# Patient Record
Sex: Female | Born: 1975 | Race: White | Hispanic: No | Marital: Married | Smoking: Current every day smoker
Health system: Southern US, Community
[De-identification: ages and names within clinical notes are randomized; demographics above are authoritative.]

## PROBLEM LIST (undated history)

## (undated) DIAGNOSIS — F32A Depression, unspecified: Secondary | ICD-10-CM

## (undated) DIAGNOSIS — R569 Unspecified convulsions: Secondary | ICD-10-CM

## (undated) DIAGNOSIS — R51 Headache: Secondary | ICD-10-CM

## (undated) DIAGNOSIS — F329 Major depressive disorder, single episode, unspecified: Secondary | ICD-10-CM

## (undated) DIAGNOSIS — R519 Headache, unspecified: Secondary | ICD-10-CM

## (undated) DIAGNOSIS — F419 Anxiety disorder, unspecified: Secondary | ICD-10-CM

## (undated) DIAGNOSIS — K219 Gastro-esophageal reflux disease without esophagitis: Secondary | ICD-10-CM

## (undated) HISTORY — PX: CARPAL TUNNEL RELEASE: SHX101

## (undated) HISTORY — PX: SHOULDER ARTHROSCOPY: SHX128

## (undated) HISTORY — PX: ABDOMINAL HYSTERECTOMY: SHX81

## (undated) HISTORY — PX: OTHER SURGICAL HISTORY: SHX169

---

## 2003-04-18 ENCOUNTER — Emergency Department (HOSPITAL_COMMUNITY): Admission: EM | Admit: 2003-04-18 | Discharge: 2003-04-18 | Payer: Self-pay | Admitting: Emergency Medicine

## 2004-09-05 ENCOUNTER — Emergency Department (HOSPITAL_COMMUNITY): Admission: EM | Admit: 2004-09-05 | Discharge: 2004-09-05 | Payer: Self-pay | Admitting: *Deleted

## 2005-01-02 ENCOUNTER — Inpatient Hospital Stay (HOSPITAL_COMMUNITY): Admission: AD | Admit: 2005-01-02 | Discharge: 2005-01-02 | Payer: Self-pay | Admitting: *Deleted

## 2005-01-02 ENCOUNTER — Ambulatory Visit: Payer: Self-pay | Admitting: Obstetrics and Gynecology

## 2005-01-21 ENCOUNTER — Encounter (INDEPENDENT_AMBULATORY_CARE_PROVIDER_SITE_OTHER): Payer: Self-pay | Admitting: *Deleted

## 2005-01-21 ENCOUNTER — Ambulatory Visit: Payer: Self-pay | Admitting: *Deleted

## 2005-02-06 ENCOUNTER — Ambulatory Visit: Payer: Self-pay | Admitting: Family Medicine

## 2005-02-10 ENCOUNTER — Ambulatory Visit (HOSPITAL_COMMUNITY): Admission: RE | Admit: 2005-02-10 | Discharge: 2005-02-10 | Payer: Self-pay | Admitting: *Deleted

## 2005-02-20 ENCOUNTER — Ambulatory Visit: Payer: Self-pay | Admitting: *Deleted

## 2005-02-20 ENCOUNTER — Inpatient Hospital Stay (HOSPITAL_COMMUNITY): Admission: AD | Admit: 2005-02-20 | Discharge: 2005-02-23 | Payer: Self-pay | Admitting: *Deleted

## 2005-02-22 ENCOUNTER — Ambulatory Visit: Payer: Self-pay | Admitting: *Deleted

## 2005-05-06 ENCOUNTER — Encounter (INDEPENDENT_AMBULATORY_CARE_PROVIDER_SITE_OTHER): Payer: Self-pay | Admitting: Specialist

## 2005-05-06 ENCOUNTER — Ambulatory Visit: Payer: Self-pay | Admitting: *Deleted

## 2005-06-16 ENCOUNTER — Ambulatory Visit (HOSPITAL_COMMUNITY): Admission: RE | Admit: 2005-06-16 | Discharge: 2005-06-16 | Payer: Self-pay | Admitting: Obstetrics and Gynecology

## 2005-06-16 ENCOUNTER — Ambulatory Visit: Payer: Self-pay | Admitting: Obstetrics and Gynecology

## 2005-06-30 ENCOUNTER — Inpatient Hospital Stay (HOSPITAL_COMMUNITY): Admission: AD | Admit: 2005-06-30 | Discharge: 2005-06-30 | Payer: Self-pay | Admitting: Obstetrics & Gynecology

## 2005-12-17 ENCOUNTER — Ambulatory Visit: Payer: Self-pay | Admitting: Obstetrics and Gynecology

## 2005-12-24 ENCOUNTER — Ambulatory Visit (HOSPITAL_COMMUNITY): Admission: RE | Admit: 2005-12-24 | Discharge: 2005-12-24 | Payer: Self-pay | Admitting: Obstetrics & Gynecology

## 2005-12-29 ENCOUNTER — Encounter (INDEPENDENT_AMBULATORY_CARE_PROVIDER_SITE_OTHER): Payer: Self-pay | Admitting: Specialist

## 2005-12-29 ENCOUNTER — Ambulatory Visit (HOSPITAL_COMMUNITY): Admission: RE | Admit: 2005-12-29 | Discharge: 2005-12-29 | Payer: Self-pay | Admitting: Obstetrics and Gynecology

## 2005-12-29 ENCOUNTER — Ambulatory Visit: Payer: Self-pay | Admitting: Obstetrics and Gynecology

## 2006-01-22 ENCOUNTER — Ambulatory Visit: Payer: Self-pay | Admitting: Obstetrics and Gynecology

## 2006-04-22 ENCOUNTER — Ambulatory Visit: Payer: Self-pay | Admitting: Obstetrics & Gynecology

## 2006-04-22 ENCOUNTER — Inpatient Hospital Stay (HOSPITAL_COMMUNITY): Admission: AD | Admit: 2006-04-22 | Discharge: 2006-04-22 | Payer: Self-pay | Admitting: Obstetrics & Gynecology

## 2006-05-26 ENCOUNTER — Ambulatory Visit: Payer: Self-pay | Admitting: Obstetrics and Gynecology

## 2006-05-26 ENCOUNTER — Inpatient Hospital Stay (HOSPITAL_COMMUNITY): Admission: RE | Admit: 2006-05-26 | Discharge: 2006-05-29 | Payer: Self-pay | Admitting: Obstetrics and Gynecology

## 2006-05-26 ENCOUNTER — Encounter (INDEPENDENT_AMBULATORY_CARE_PROVIDER_SITE_OTHER): Payer: Self-pay | Admitting: Specialist

## 2006-05-30 ENCOUNTER — Inpatient Hospital Stay (HOSPITAL_COMMUNITY): Admission: AD | Admit: 2006-05-30 | Discharge: 2006-05-30 | Payer: Self-pay | Admitting: Gynecology

## 2006-06-12 ENCOUNTER — Ambulatory Visit: Payer: Self-pay | Admitting: Gynecology

## 2008-03-30 ENCOUNTER — Ambulatory Visit: Payer: Self-pay | Admitting: Obstetrics and Gynecology

## 2008-05-31 ENCOUNTER — Observation Stay (HOSPITAL_COMMUNITY): Admission: RE | Admit: 2008-05-31 | Discharge: 2008-06-01 | Payer: Self-pay | Admitting: General Surgery

## 2008-05-31 ENCOUNTER — Encounter (INDEPENDENT_AMBULATORY_CARE_PROVIDER_SITE_OTHER): Payer: Self-pay | Admitting: General Surgery

## 2009-08-03 ENCOUNTER — Ambulatory Visit (HOSPITAL_COMMUNITY): Admission: RE | Admit: 2009-08-03 | Discharge: 2009-08-03 | Payer: Self-pay | Admitting: General Surgery

## 2009-11-13 ENCOUNTER — Emergency Department: Payer: Self-pay | Admitting: Emergency Medicine

## 2010-08-04 ENCOUNTER — Encounter: Payer: Self-pay | Admitting: *Deleted

## 2010-09-29 LAB — CBC
HCT: 41 % (ref 36.0–46.0)
WBC: 8.3 10*3/uL (ref 4.0–10.5)

## 2010-09-29 LAB — DIFFERENTIAL
Basophils Absolute: 0 10*3/uL (ref 0.0–0.1)
Basophils Relative: 0 % (ref 0–1)
Eosinophils Absolute: 0.1 10*3/uL (ref 0.0–0.7)
Eosinophils Relative: 1 % (ref 0–5)
Lymphocytes Relative: 40 % (ref 12–46)
Monocytes Relative: 8 % (ref 3–12)

## 2010-09-29 LAB — PROTIME-INR: INR: 1.1 (ref 0.00–1.49)

## 2010-09-29 LAB — APTT: aPTT: 28 seconds (ref 24–37)

## 2010-11-26 NOTE — Group Therapy Note (Signed)
NAME:  Krystal [name], MOMON NO.:  [id number]   MEDICAL RECORD NO.:  [id number]          PATIENT TYPE:  WOC   LOCATION:  [facility]                   FACILITY:  [facility]   PHYSICIAN:  Argentina Donovan, MD        DATE OF BIRTH:  February 26, 1976   DATE OF SERVICE:  03/30/2008                                  CLINIC NOTE   The patient is a 35 year old Caucasian female gravida 4, para 3-0-2-4,  who underwent total abdominal hysterectomy and revision of scar several  years ago.  She comes in today for annual exam with chief complaint of  swelling, bleeding, hemorrhoids with skin that breaks open, does not  heal, and is sore all the time.  She also is complaining of pain around  the urethra, that showers or urinating bother her.   PHYSICAL EXAMINATION:  BREASTS:  Her breasts are symmetrical with  implants.  No dominant masses.  No axillary nodes noted.  No nipple  discharge.  ABDOMEN:  Soft, flat, and nontender.  No masses or organomegaly with a  well-healed suprapubic transverse scar.  GENITALIA:  The external genitalia is significant for a small lesion on  the left labia up near the urethra, which was cultured for virus.  Also,  wet prep was taken.  The vagina, however, is clean with no significant  discharge noted.  Status hysterectomy, well rugated, and introitus is  marital.  BUS appeared normal.  The perineum between the portion of the  vagina and the anal area seems thin with small fissures.  She has  significant external hemorrhoids, but was very resistant to rectal  examination internally.  On bimanual pelvic examination, the ovary  seemed normal.  No pelvic masses were noted.   IMPRESSION:  Symptomatic hemorrhoids with anal fissures, urethritis.   Plan is to refer the patient to a general surgeon for hemorrhoid removal  and give the patient a trial for the urethritis of doxycycline 100 mg a  day for 10 days and application of Mycolog II frequently for at least 1  week on the  perineal area.            ______________________________  Argentina Donovan, MD     PR/MEDQ  D:  03/30/2008  T:  03/31/2008  Job:  [id number]

## 2010-11-26 NOTE — Op Note (Signed)
NAMESHEIKA, COUTTS            ACCOUNT NO.:  [id number]   MEDICAL RECORD NO.:  [id number]          PATIENT TYPE:  OBV   LOCATION:  0002                         FACILITY:  [facility]   PHYSICIAN:  Lennie Muckle, MD      DATE OF BIRTH:  Aug 26, 1975   DATE OF PROCEDURE:  05/31/2008  DATE OF DISCHARGE:                               OPERATIVE REPORT   PREOPERATIVE DIAGNOSIS:  Hemorrhoids.   POSTOPERATIVE DIAGNOSIS:  Internal and external hemorrhoids.   PROCEDURE:  Excision of hemorrhoids x2.   SURGEON:  Lennie Muckle, MD.   ASSISTANT:  Leonie Man, MD   ANESTHESIA:  General endotracheal anesthesia.   FINDINGS:  Large internal and external hemorrhoid noted at 2 o'clock and  7 to 8 o'clock.  No evidence of fissure.  No evidence of a pile.   ESTIMATED BLOOD LOSS:  15 mL.   DRAINS:  No drains were placed.   COMPLICATIONS:  No immediate complications.   INDICATIONS FOR PROCEDURE:  Ms. Laural Benes is a 35 year old female who had  had problems with hemorrhoids since pregnancy 13 years ago.  She has had  several episodes of acute pain and swelling.  She has had multiple  trials of medications.  She attempted exam in the office but she has a  significant amount of pain therefore I could not get a full exam to  evaluate for either anal fissure or internal component.  Informed  consent was obtained for a rectal exam under anesthesia,  hemorrhoidectomy with a possible sphincterotomy.   DESCRIPTION OF PROCEDURE:  Ms. Laural Benes was identified in the  preoperative holding suite room.  She did receive IV antibiotics and was  taken to the operating room.  Once in the operating room she was  intubated.  She was then turned in a prone jackknife position.  Her  perineum was prepped and draped in the usual sterile fashion.  External  hemorrhoids were seen on examination.  There was also an internal  component as well.  Digital examination revealed good laxity with  ability to dilate with 4 fingers.  No evidence of a stricture or a pile.  The two largest hemorroids were noted at around 7 to 8 o'clock as well  as 2 o'clock. Using anoscopy and an Allis clamp we elected to excise the  largest hemorrhoid around 7 to 8 o'clock.  Using electrocautery got to  the base of the hemorrhoid and fully excised the hemorrhoid tissue with  care noted to avoid internal and external sphincter muscle.  Using a  chromic suture was used to close the skin.  Then further exam revealed  the second largest hemorrhoid tissue noted at 2 o'clock.  Again this was  incised at the base with internal component carefully avoiding the  internal and external sphincters.  This was also closed with a 3-0  chromic suture.  After this the patient had significant improvement in  the exam of the tissue.  There was a small hemorrhoid noted around 12  o'clock.  Due to the concern for having stricturing I elected not to  perform any further excision of  tissue.  Removing the tape of the  buttocks there appeared to be no extrusion of mucosa or further evidence  of prolapsing hemorrhoids.  A piece of Gelfoam was placed in the anus.  She was anesthetized with approximately 40 mL of Marcaine with  epinephrine at 12 o'clock, 3 o'clock, 6 o'clock as well as 9 o'clock.  Each hemorrhoid cushion was also injected with approximately 5 mL of the  anesthetic.  On final inspection there was no bleeding.  The Gelfoam was  applied to the anus.  The patient had dry gauze then applied.  She was  then turned to the supine position, extubated and transported to the  post anesthesia care unit in stable condition.  She will be given  Vicodin 7.5/200 one to two every 4 hours for pain #45 was dispensed.  She was also instructed to take Tylenol over-the-counter and began two  stool softeners beginning today.  She will be told to perform sitz baths  three times a day and will follow up in approximately 2-3 weeks.      Lennie Muckle, MD   Electronically Signed     ALA/MEDQ  D:  05/31/2008  T:  05/31/2008  Job:  [id number]   cc:   Lorenso Quarry  Fax: [phone]

## 2010-11-29 NOTE — Op Note (Signed)
Krystal [name], [name]            ACCOUNT NO.:  [id number]   MEDICAL RECORD NO.:  [id number]          PATIENT TYPE:  INP   LOCATION:  9310                          [facility]ILITY[facility]   PHYSICIAN:  Phil D. Okey Dupre, M.D.     DATE OF BIRTH:  [birth year]   DATE OF PROCEDURE:  05/26/2006  DATE OF DISCHARGE:                                 OPERATIVE REPORT   PREOPERATIVE DIAGNOSIS:  A 35 year old female with pelvic pain, dysmenorrhea  and atrophic scar.   POSTOPERATIVE DIAGNOSES:  87. A 35 year old female with pelvic pain, dysmenorrhea and atrophic scar.  2. Pelvic adhesions.   PROCEDURES:  1. Total abdominal hysterectomy.  2. Scar revision.   SURGEON:  Dr. Argentina Donovan.   ASSISTANT:  Dr. Elsie Lincoln.   ANESTHESIA:  General.   SPECIMEN:  Uterus and cervix to pathology.   EBL:  150.   COMPLICATIONS:  None.   FINDINGS:  1. A slightly enlarged uterus.  2. Grossly normal ovaries,.  3. Surgically ligated fallopian tubes.  4. Pelvic adhesions.  5. A simple cyst on the right ovary, approximately 1 x 1 cm.  6. Extremely atrophic scar that was sunken in and very bothersome to the      patient.   PROCEDURE:  After informed consent was obtained, the patient was taken to  the operating room where general anesthesia was induced.  The patient was  placed in the dorsal supine position, the __________ draped in normal  sterile fashion.  A Foley was placed in the bladder.  The skin was incised  with a scalpel and the scar was removed down to the fascia.  The fascia was  incised in the midline and the incision was extended bilaterally.  The  superior and inferior aspects of the fascial incision were grasped with  Kocher clamps, tented up, and dissected off sharply and bluntly from  underlying layers of rectus muscles.  The rectus muscle was separated in the  midline, the peritoneum was entered bluntly and this incision was extended  both superior and inferiorly with good visualization of the  bladder.  The  Balfour retractor was placed into the abdomen and the bowel was packed away.  The uterus was brought into the surgical field and some adhesions were taken  down.  The round ligaments were identified, clamped, transected, and suture  ligated with chromic.  The bladder flap was then created.  The uterine  ovarian pedicles were then clamped, transected and suture ligated with 0  chromic x2 and good hemostasis was noted.  The uterine arteries were  skeletonized, clamped, transected and suture ligated with 0 Vicryl.  The  cardinal ligaments and uterosacral ligaments were then clamped, transected  and suture ligated with chromic in a stepwise fashion.  Good hemostasis was  noted once at the bottom of the cervix.  A scalpel was used to enter the  vagina and the Jorgenson scissors were used to cut at the top of the vagina  and remove the cervix from the vagina.  A specimen was handed off to the  surgical technician.  The vaginal angles were closed  with 0 Vicryl and good  hemostasis was noted.  The vaginal cuff was closed with 0 Vicryl in a  running locked fashion and good hemostasis was noted.  The peritoneal cavity  was copiously irrigated with warm saline and good hemostasis was noted.  All  instruments and packs were removed from the abdomen and hemostasis was  assured one last time.  The peritoneum was hemostatic.  The rectus muscles  were reapproximated loosely in the middle with chromic. The fascia was  closed with #0 Vicryl in a running fashion and good hemostasis was noted.  The subcutaneous tissue was copiously irrigated and subcutaneous tissue was  brought together with #2-0 Vicryl mattress sutures and the skin was brought  together with #4-0 Vicryl mattress sutures on a Keith needle in an  interrupted fashion.  The patient tolerated the procedure well.  Sponge,  lap, instrument and needle count were correct x2.  The patient went to the  recovery room in stable  condition.     ______________________________  Lesly Dukes, M.D.    ______________________________  Javier Glazier. Okey Dupre, M.D.    Lora Paula  D:  05/26/2006  T:  05/26/2006  Job:  [id number]

## 2010-11-29 NOTE — Group Therapy Note (Signed)
NAME:  Krystal [name], Krystal [name] NO.:  [id number]   MEDICAL RECORD NO.:  [id number]          PATIENT TYPE:  WOC   LOCATION:  [facility]                   FACILITY:  [facility]   PHYSICIAN:  Argentina Donovan, MD        DATE OF BIRTH:  05-Jun-1976   DATE OF SERVICE:  12/17/2005                                    CLINIC NOTE   HISTORY:  The patient is a 35 year old, gravida 3, para 3-0-0-4 who  delivered twins by cesarean section 9 months ago and then in December of  last year underwent a laparoscopic tubal ligation at which time left adnexal  adhesions where the tube was adhesed to the anterior abdominal wall were  dissected. Since that time, the patient has had lower abdominal pain at the  incision site especially on the left side. She has also complained of  continual intramenstrual spotting, menstrual distress and premenstrual  symptoms since the birth of her baby and have gotten worse since her tubal  ligation. She was placed one month ago on oral contraceptives to try and  control the bleeding by some other doctor but she spotted in spite of that  and in the last day or so she developed signs of urinary tract infection  i.e. frequency, dysuria, and false urge. She also complains of a low back  pain on the right feeling like a hot coal but she has had problem with lower  chronic back pain probably due to a lumbar disk. Recently her grandmother  died of ovarian cancer and she thinks she has all those symptoms.   EXAMINATION:  On examination, her abdomen is soft, flat but very gassy,  nontender to deep palpation without rebound or guarding. The incision site  of the C section that was done in [facility] and the laparoscopy site  seemed normal. External genitalia is normal, BUS within normal limits. The  vagina is pink and well-rugated with a pinkish creamy discharge that has a  foul odor. The uterus is anterior of normal size, shape, consistency and the  adnexa appears normal.   PLAN:  Get an ultrasound, urinalysis, schedule the patient for a  hysteroscopy D&C and at the same time while she is in the operating room to  inject Kenalog-10 into the surgical sites of the cesarean section and  laparoscopy to help control the pain if it is localized and the itching. I  am going to give the patient a prescription for Pyridium, Flagyl and  Macrobid.   IMPRESSION:  Probably urinary tract infection, intractable abnormal  intramenstrual bleeding, dysmenorrhea, possible premenstrual dysphoric  disorder.           ______________________________  Argentina Donovan, MD     PR/MEDQ  D:  12/17/2005  T:  12/18/2005  Job:  [id number]

## 2010-11-29 NOTE — Discharge Summary (Signed)
Krystal [name], Krystal [name]            ACCOUNT NO.:  [id number]   MEDICAL RECORD NO.:  [id number]          PATIENT TYPE:  INP   LOCATION:  9310                          [facility]   PHYSICIAN:  Phil D. Okey Dupre, M.D.     DATE OF BIRTH:  03-16-1976   DATE OF ADMISSION:  05/26/2006  DATE OF DISCHARGE:  05/29/2006                                 DISCHARGE SUMMARY   The patient is a 35 year old, multiparous, white female who underwent total  abdominal hysterectomy and revision of abdominal wound on the day of  admission.  She has done very well postoperatively with the exception of  some problem with pain control which was anticipated with her pain  toleration normal prior to admission.  She had been afebrile her entire  course.  She has a discharge hemoglobin and 10.2 with hematocrit 29.6.  White count has been normal throughout her stay.  The wound has been healing  well.  The mattress sutures were removed on the day of discharge.  On the  far right angle, there was a small seroma which drained and was cleaned with  peroxide and then reclosed with a Steri-Strip.   DISCHARGE MEDICATIONS:  1. Percocet for pain.  2. Motrin for pain.  3. Slow FE for iron replacement.  4. Nicotine patch which she is going to continue at home which has      successfully been used here.,  5. Terazol for some vulvar itching probably secondary to the antibiotic      she was on.   DISCHARGE INSTRUCTIONS:  The patient has been given detailed instructions as  to activity, followup and diet especially related to heavy lifting and  driving.  She will be seen in the office in two weeks.  She has also been  given instructions with regard to if the wound opens or if she has any high  fever to call earlier or come in sooner.   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           ______________________________  Javier Glazier. Okey Dupre, M.D.     PDR/MEDQ  D:  05/29/2006  T:   05/29/2006  Job:  [id number]

## 2010-11-29 NOTE — Group Therapy Note (Signed)
NAMEMarland Kitchen  JAY, KEMPE NO.:  [id number]   MEDICAL RECORD NO.:  [id number]          PATIENT TYPE:  WOC   LOCATION:  [facility]                   [facility]ACILITY:  [facility]   PHYSICIAN:  Ginger Carne, MD DATE OF BIRTH:  07/02/1976   DATE OF SERVICE:  06/12/2006                                  CLINIC NOTE   Mrs. Krystal [name] returns today following two weeks after having had a  hysterectomy. The patient had undergone a total abdominal hysterectomy  with revision of an abdominal wound on the 13th of November 2007. Her  incision looks clean, dry, with a normal healing ridge and slight upper  incisional swelling. I reassured the patient of same. I think she is  overly concerned about the swelling around the incision. I reassured her  that this is normal and that it would take several months to see  complete resolution, and evenness of the incision. She has slight scant  fascial flowage, which is also normal. It is yellow discharge, but not  symptomatic. She will return in four weeks for her routine postoperative  care. In addition, the patient was reassured about an ultrasound  obtained in [facility], which demonstrated a small physiological ovarian cyst  on the right ovary.           ______________________________  Ginger Carne, MD     SHB/MEDQ  D:  06/12/2006  T:  06/12/2006  Job:  [id number]

## 2010-11-29 NOTE — Discharge Summary (Signed)
NAMEDANESE, DORSAINVIL            ACCOUNT NO.:  [id number]   MEDICAL RECORD NO.:  [id number]          PATIENT TYPE:  INP   LOCATION:  9157                          [facility]   PHYSICIAN:  Phil D. Okey Dupre, M.D.     DATE OF BIRTH:  August 01, 1975   DATE OF ADMISSION:  02/20/2005  DATE OF DISCHARGE:  02/23/2005                                 DISCHARGE SUMMARY   HOSPITAL COURSE:  The patient is a 35 year old gravida 5 para 2-0-2-2 who  presented at 32 weeks in preterm labor with complaint of headache, fatigue,  and left leg swelling more than the right. She was being followed by the  high-risk clinic and a twin gestation. On admission, she had a cervical os  effacement of 80% with a dilatation of 2.5 cm. PIH labs were normal and she  was GBS positive. She was admitted and given betamethasone, and tocolytics  to try and control her labor. On the February 21, 2005, because of the lact of  ability to stop labor, she was transferred from antepartum to labor and  delivery for magnesium sulfate, which was started on the August 11. On  August 12, we were very certain that she was going to deliver and we started  prophylaxis with penicillin for GBS, and labor became impossible to stop. We  realized at this time the patient was in active labor. The NICU at this time  had no available beds so we made arrangements for a transfer to [facility], which was carried out on the day of discharge, August 12.   DISCHARGE DIAGNOSIS:  Thirty-two-week gestation, preterm labor, probable  inevitable delivery.   PLAN:  Transfer to [facility] because of NICU lack of beds.           ______________________________  Javier Glazier Okey Dupre, M.D.     PDR/MEDQ  D:  04/09/2005  T:  04/09/2005  Job:  [id number]

## 2010-11-29 NOTE — H&P (Signed)
NAME:  Krystal [name], Krystal [name]                ACCOUNT NO.:  0   MEDICAL RECORD NO.:  [id number]           PATIENT TYPE:   LOCATION:                                 FACILITY:   PHYSICIAN:  Phil D. Okey Dupre, M.D.     DATE OF BIRTH:  1976-05-03   DATE OF ADMISSION:  05/26/2006  DATE OF DISCHARGE:                                HISTORY & PHYSICAL   CHIEF COMPLAINT:  Severe lower abdominal pain and pain in incision with  extremely heavy periods and weakness.   HISTORY OF PRESENT ILLNESS:  The patient is a 35 year old, white female, G4,  P3-0-2-4 whose first baby was cesarean section for breech, the second one a  vaginal delivery and the last in August, 2 years ago, of twins by cesarean  section.  Following that, she underwent laparoscopic sterilization and had a  moderate amount of pelvic adhesions especially in the area of the left tube  and the anterior wall.  These were taken down during the procedure.  She had  complaint after that of itching in the laparoscopic and surgical scar from  the C-section which was treated with Kenalog 10.  She had a small  endometrial polyp and pathology was normal.  The patient has continued to  have increasingly severe dysmenorrhea that makes it difficult for her to  take care of her kids with chronic pelvic pain, spotting and bleeding almost  every day.  She does have chronic back pain with several hot spots that  radiate around the lower abdomen and seems to be worse when she is having a  period.  We have discussed options and because of the aesthetically  unpleasant and painful thinned out suprapubic scar, we have opted to do a  hysterectomy abdominally.  We will revise her incision and do a total  abdominal hysterectomy.  We discussed in detail the procedure with possible  complications, especially those related to anesthesia, injury to bowel,  urinary tract, hemorrhage and infection.  The patient is scheduled for a  total abdominal hysterectomy.   PAST  MEDICAL HISTORY:  Pregnancy-induced hypertension.   PAST SURGICAL HISTORY:  1. Bilateral tubal ligation.  2. D&C.  3. Two cesarean sections.   SOCIAL HISTORY:  She smokes a half pack a day.  She does not drink or use  illicit drugs.   FAMILY HISTORY:  Noncontributory.   REVIEW OF SYSTEMS:  Negative with the exception of in the history of present  illness.   PHYSICAL EXAMINATION:  VITAL SIGNS:  Blood pressure 109/71, respirations 18  per minute, pulse 76, temperature 97.7.  GENERAL:  Well-developed, slender, white female in no acute distress.  HEENT:  Within normal limits.  PERRLA.  NECK:  Supple with no masses.  Thyroid symmetrical with no dominant masses.  LUNGS:  Clear to auscultation and percussion.  HEART:  No murmur, normal sinus rhythm.  No rubs or gallops.  PMI fifth IS  and MCL.  BREASTS:  Symmetrical without dominant masses.  No nipple discharge.  BACK:  Erect and nontender.  ABDOMEN:  Soft, flat.  Area of  the lower surgical scar markedly atrophic,  but no guarding or rebound.  GENITALIA:  External is normal.  Cervix is clean and parous.  Adnexa normal.  Vagina is clean and well-rugated.  Uterus is normal size, shape and  consistency.  RECTAL:  No masses.  SKIN:  Normal turgor and pallor.   IMPRESSION:  Chronic pelvic pain with severe disabling dysmenorrhea and  menorrhagia and painful surgical scar.   PLAN:  Total abdominal hysterectomy and revision of lower abdominal scar.           ______________________________  Javier Glazier. Okey Dupre, M.D.     PDR/MEDQ  D:  05/19/2006  T:  05/19/2006  Job:  [id number]

## 2010-11-29 NOTE — Group Therapy Note (Signed)
NAME:  Krystal [name], Krystal [name] NO.:  [id number]   MEDICAL RECORD NO.:  [id number]          PATIENT TYPE:  WOC   LOCATION:  [facility]                   FACILITY:  [facility]   PHYSICIAN:  Carolanne Grumbling, M.D.   DATE OF BIRTH:  11-12-1975   DATE OF SERVICE:  05/06/2005                                    CLINIC NOTE   Patient is a 35 year old G5, P2-2-4 here for her six-week postpartum  examination.  She had a repeat cesarean section on February 24, 2005 at  [place].  She had to be transferred there to deliver her twins because  there were no NICU beds available here.  She went to preterm labor and also  had preeclampsia.  Since the delivery patient has done relatively well.  She  has had spotting daily.  She had Depo on August 16 and reports the last time  she had Depo she also bled every day.  She has had intercourse.  Has noticed  some foul smelling vaginal discharge.   PHYSICAL EXAMINATION:  VITAL SIGNS:  Per chart.  GENERAL:  Well-developed, well-nourished female in no apparent distress.  CARDIOVASCULAR:  Regular rate and rhythm.  LUNGS:  Clear to auscultation bilaterally.  ABDOMEN:  Soft, nontender, nondistended.  She does have an area around her  incision that feels numb.  Incision clean, dry, intact.  GENITOURINARY:  No external genitalia.  Cervix is parous and she does have a  Nabothian cyst at 6 o'clock.  Pap smear was done.  Gonorrhea and Chlamydia  and wet prep also done.   IMPRESSION:  1.  Six-week postpartum.  2.  Desire for bilateral tubal ligation.   PLAN:  Will notify patient results of her laboratories.  Counseled on the  risks and benefits of tubal and she wants to proceed.  Surgery will be  scheduled for her.           ______________________________  Carolanne Grumbling, M.D.     TW/MEDQ  D:  05/06/2005  T:  05/07/2005  Job:  [id number]

## 2010-11-29 NOTE — Op Note (Signed)
Krystal [name], Krystal [name]            ACCOUNT NO.:  [id number]   MEDICAL RECORD NO.:  [id number]          PATIENT TYPE:  AMB   LOCATION:  [facility]                           [facility]:  [facility]   PHYSICIAN:  Phil D. Okey Dupre, M.D.     DATE OF BIRTH:  May 05, 1976   DATE OF PROCEDURE:  12/29/2005  DATE OF DISCHARGE:                                 OPERATIVE REPORT   PROCEDURE:  Hysteroscopic examination, dilatation and curettage and  ____________ abdominal scars.   PREOPERATIVE DIAGNOSIS:  Menometrorrhagia, dysmenorrhea and chronic pruritus  of abdominal cicatrices.   DESCRIPTION:  Procedure went as follows:  Under satisfactory general  anesthesia with the patient in dorsal lithotomy position, the perineum and  vagina were prepped and draped in the usual sterile manner as were the  surgical scars from a previous C-section and periumbilical from a  laparoscopy.  A total of 10 mL of Kenalog 10 was injected subcutaneously  under each of those scars to try to control the patient's severe itching.  At that time, the perineum was draped in the usual sterile manner.  Bimanual  pelvic examination revealed a small anterior uterus, freely movable with a  normal cul-de-sac.  The adnexa was normal.  Weighted speculum was placed in  the posterior fourchette of the vagina through a marital introitus. BUS  within normal limits.  The vagina was clean and well rugated.  The anterior  lip of a clean parous cervix was grasped with a single-tooth tenaculum.  Uterine cavity sounded to 7 cm.  Cervical os dilated to #7 Hagar dilator.  A  12-degrees hysteroscope was used, inserted to the fundus, and using normal  saline as a dilating medium, the uterine cavity was explored and found to be  completely within normal limits with the exception of one tiny little polyp  just near the introitus, which then the uterus was curetted, the cavity with  a very small sharp curette, and the tissue sent for pathological diagnosis.  The  uterine cavity was then re-explored with a hysteroscope and found that  the polyp had been removed.  Tenaculum and speculum removed from vagina.  The patient transferred to the recovery in satisfactory condition, having  tolerated the procedure well.           ______________________________  Javier Glazier. Okey Dupre, M.D.     PDR/MEDQ  D:  12/29/2005  T:  12/30/2005  Job:  [id number]

## 2010-11-29 NOTE — Group Therapy Note (Signed)
NAMEMarland Kitchen  [name], Krystal NO.:  [id number]   MEDICAL RECORD NO.:  [id number]          PATIENT TYPE:  WOC   LOCATION:  [facility]                   [facility]AC[facility]   PHYSICIAN:  Argentina Donovan, MD        DATE OF BIRTH:  1975/11/21   DATE OF SERVICE:  04/28/2006                                    CLINIC NOTE   The patient is a 35 year old white female, gravida 4, para 2-0-2-4, whose  first baby was cesarean section for breech. The second baby was a vaginal  delivery, and in August of last year had twins by cesarean section.  Following that, she underwent laparoscopic sterilization and had a moderate  amount of pelvic adhesions, especially in the area of the left tube to the  anterior wall which were taken down during that procedure. She had a  complaint after that of severe teaching in the area of her laparoscopic scar  and her previous surgical scar for her C section which was done in [facility]. We injected those was Kenalog 10, and she underwent D&C for  metromenorrhagia and dysmenorrhea.  She had a small endometrial polyp, but  the pathology was normal. The patient has continued having severe  dysmenorrhea with chronic pelvic pain and spotting or bleeding almost all  the time. She does have chronic back pain with several hot spots that  radiate around to the lower abdomen which seems to be worse when she is  having a period.  We have discussed options and, because of the esthetically  unpleasant and painful and thinned out suprapubic scar, we have opted to do  her hysterectomy abdominally. We will revise her incision and do a total  abdominal hysterectomy. We have discussed in detail the procedure and  possible complications, especially those related to anesthesia, injury to  bowel, urinary tract, hemorrhage and infection, and we will schedule the  patient for late November or early December   PAST MEDICAL HISTORY:  Pregnancy-induced hypertension.   SURGERIES:   BTL, D&C, two cesarean sections.   SOCIAL HISTORY:  She smokes 1/2 pack a day, does not drink or use illicit  drugs.   FAMILY HISTORY:  Noncontributory   PHYSICAL EXAMINATION:  VITAL SIGNS: Blood pressure 109/71, respirations 18  per minute, pulse 76 per minute, temperature 97.7.  GENERAL:  Well-developed, slender, white female in no acute distress.  HEENT:  Within normal limits.  NECK:  Supple with no masses.  The thyroid is symmetrical with no dominant  masses.  LUNGS:  Clear to auscultation and percussion.  HEART: No murmur, normal sinus rhythm.  No rushes or gallops.  PMI  __________  BREASTS: Symmetrical without dominant masses.  No nipple discharge.  BACK: Erect and nontender.  ABDOMEN: Soft, flat.  There is an area of the lower surgical scar which is  markedly atrophic but no guarding or rebound.  GENITALIA:  External is normal. Cervix is clean and parous. Adnexa is  normal.  Vagina is clean and well rugated.  Uterus is normal size, shape,  consistency.  RECTAL:  No masses.  SKIN: Normal turgor.   IMPRESSION:  Chronic pelvic pain with severe disabling dysmenorrhea and  menometrorrhagia.   PLAN:  Total abdominal hysterectomy and revision of lower abdominal surgical  scar.           ______________________________  Argentina Donovan, MD     PR/MEDQ  D:  04/28/2006  T:  04/29/2006  Job:  [id number]

## 2010-11-29 NOTE — Group Therapy Note (Signed)
NAME:  Krystal [name], Krystal [name] NO.:  [id number]   MEDICAL RECORD NO.:  [id number]          PATIENT TYPE:  WOC   LOCATION:  [facility]                   [facility]   PHYSICIAN:  Argentina Donovan, MD        DATE OF BIRTH:  29-May-1976   DATE OF SERVICE:  01/22/2006                                    CLINIC NOTE   The patient is a 35 year old white female, gravida 4, para 2-0-2-4, who  underwent D&C and injection of surgical scars with Kenalog 10 for itching 2  weeks ago.  The D&C was because of menometrorrhagia and dysmenorrhea.  She  had a small endometrial polyp, but the pathology was normal.  She comes in  today.  She has not had any bleeding since then.  She has had some vulvar  itching and a foul smell since she had intercourse.  A white blood prep was  done today.  We will observe for the bleeding, as she has not started having  her period as of yet.  Postop exam was normal.           ______________________________  Argentina Donovan, MD     PR/MEDQ  D:  01/22/2006  T:  01/22/2006  Job:  [id number]

## 2010-11-29 NOTE — Op Note (Signed)
Krystal [name], Krystal [name]            ACCOUNT NO.:  [id number]   MEDICAL RECORD NO.:  [id number]          PATIENT TYPE:  AMB   LOCATION:  [facility]                           [facility]   PHYSICIAN:  Phil D. Okey Dupre, M.D.     DATE OF BIRTH:  1976-03-14   DATE OF PROCEDURE:  06/16/2005  DATE OF DISCHARGE:                                 OPERATIVE REPORT   PROCEDURE:  Laparoscopic sterilization and lysis of pelvic adhesions.   PREOPERATIVE DIAGNOSIS:  Voluntary sterilization.   POSTOPERATIVE DIAGNOSIS:  Voluntary sterilization plus pelvic adhesions.   SURGEON:  Dr. Okey Dupre   ANESTHESIA:  General.   BLOOD LOSS:  Minimal.   POSTOPERATIVE CONDITION:  Satisfactory. No pathological specimen sent.   OPERATIVE FINDINGS:  On pelvic examination, the uterus was anterior flexed,  freely movable, with normal size, shape and consistency, with normal  palpating adnexa. On the anterior abdominal visualization with a  laparoscope, all the pelvic organs were completely normal with exception of  the left fallopian tube with fimbriated end was plastered against the  anterior abdominal wall probably secondary to adhesions following her  cesarean section.   DESCRIPTION OF PROCEDURE:  Under satisfactory general anesthesia with the  patient in dorsal lithotomy position, the perineum, vagina and abdomen were  prepped and draped in usual sterile manner. Graves open speculum was placed  into the vagina and the anterior lip of cervix was grasped with single-tooth  tenaculum. A mobilizing tenaculum was then placed on the cervix and attached  to the cervix for mobilization of the uterus during the procedure. The  speculum was then removed. Veress needle inserted in the peritoneal cavity  at the lower pole of the umbilicus under controlled conditions, 3 liters of  carbon dioxide slowly insufflated in the peritoneal cavity. Equal tympany  occurred over the entire abdominal wall. The Veress needle was removed. A 1  cm  transverse incision made just below the umbilicus. Laparoscope and trocar  inserted into the peritoneal cavity. This trocar was removed from the  sleeve, laparoscope was inserted and the findings were as above. Each  fallopian tube was grasped in the midportion and coagulated with bipolar  current until complete blanching occurred and then they were transected. The  adhesions up to the anterior abdominal wall were also coagulated with the  bipolar current until blanching occurred and then cut with scissors away  from the wall thus freeing up the entire fallopian tube on the left side.  Areas observed for bleeding and none was noted. As much CO2 as possible  expressed through the sleeve. The scope was then removed under direct vision  to make sure there was no bleeding in the abdominal wall and the  incision closed with 3-0 Vicryl suture closing first the fascia and then  running subcuticular for skin edge approximation. Dry sterile dressing was  applied and the patient transferred to recovery room in satisfactory  condition having tolerated the procedure well after the tenaculum was  removed from vagina.           ______________________________  Javier Glazier. Okey Dupre, M.D.  PDR/MEDQ  D:  06/16/2005  T:  06/16/2005  Job:  [id number]

## 2011-02-01 ENCOUNTER — Emergency Department: Payer: Self-pay | Admitting: Unknown Physician Specialty

## 2013-01-28 ENCOUNTER — Emergency Department: Payer: Self-pay | Admitting: Emergency Medicine

## 2013-03-09 ENCOUNTER — Emergency Department: Payer: Self-pay | Admitting: Emergency Medicine

## 2013-03-09 LAB — CBC WITH DIFFERENTIAL/PLATELET
Basophil #: 0 10*3/uL (ref 0.0–0.1)
Basophil %: 0.3 %
Eosinophil #: 0 10*3/uL (ref 0.0–0.7)
HCT: 39.4 % (ref 35.0–47.0)
Lymphocyte %: 15.7 %
MCH: 31.6 pg (ref 26.0–34.0)
MCV: 89 fL (ref 80–100)
Monocyte #: 0.6 x10 3/mm (ref 0.2–0.9)
Platelet: 202 10*3/uL (ref 150–440)
RBC: 4.43 10*6/uL (ref 3.80–5.20)

## 2013-03-09 LAB — COMPREHENSIVE METABOLIC PANEL
Albumin: 3.8 g/dL (ref 3.4–5.0)
Alkaline Phosphatase: 77 U/L (ref 50–136)
Bilirubin,Total: 0.5 mg/dL (ref 0.2–1.0)
Calcium, Total: 8.4 mg/dL — ABNORMAL LOW (ref 8.5–10.1)
Chloride: 109 mmol/L — ABNORMAL HIGH (ref 98–107)
EGFR (Non-African Amer.): >60
Osmolality: 276 (ref 275–301)
Sodium: 139 mmol/L (ref 136–145)
Total Protein: 7.2 g/dL (ref 6.4–8.2)

## 2013-03-09 LAB — HCG, QUANTITATIVE, PREGNANCY: Beta Hcg, Quant.: <1 m[IU]/mL — ABNORMAL LOW

## 2013-03-10 LAB — URINALYSIS, COMPLETE
Glucose,UR: NEGATIVE mg/dL (ref 0–75)
Ketone: NEGATIVE
Leukocyte Esterase: NEGATIVE
Nitrite: NEGATIVE
Ph: 7 (ref 4.5–8.0)
Protein: NEGATIVE
RBC,UR: 1 /HPF (ref 0–5)
Specific Gravity: 1.008 (ref 1.003–1.030)
Squamous Epithelial: 1

## 2013-07-21 DIAGNOSIS — F419 Anxiety disorder, unspecified: Secondary | ICD-10-CM | POA: Insufficient documentation

## 2014-05-26 ENCOUNTER — Ambulatory Visit: Payer: Self-pay | Admitting: Anesthesiology

## 2015-03-07 ENCOUNTER — Encounter: Payer: Self-pay | Admitting: Emergency Medicine

## 2015-03-07 ENCOUNTER — Emergency Department: Payer: Medicaid Other

## 2015-03-07 ENCOUNTER — Emergency Department
Admission: EM | Admit: 2015-03-07 | Discharge: 2015-03-07 | Disposition: A | Discharge status: Home / Self Care | Payer: Medicaid Other | Attending: Emergency Medicine | Admitting: Emergency Medicine

## 2015-03-07 DIAGNOSIS — S42411A Displaced simple supracondylar fracture without intercondylar fracture of right humerus, initial encounter for closed fracture: Secondary | ICD-10-CM

## 2015-03-07 DIAGNOSIS — Z720 Tobacco use: Secondary | ICD-10-CM | POA: Insufficient documentation

## 2015-03-07 DIAGNOSIS — S42351G Displaced comminuted fracture of shaft of humerus, right arm, subsequent encounter for fracture with delayed healing: Secondary | ICD-10-CM

## 2015-03-07 DIAGNOSIS — S42421A Displaced comminuted supracondylar fracture without intercondylar fracture of right humerus, initial encounter for closed fracture: Secondary | ICD-10-CM | POA: Insufficient documentation

## 2015-03-07 DIAGNOSIS — S4991XD Unspecified injury of right shoulder and upper arm, subsequent encounter: Secondary | ICD-10-CM | POA: Diagnosis present

## 2015-03-07 DIAGNOSIS — Y998 Other external cause status: Secondary | ICD-10-CM | POA: Diagnosis not present

## 2015-03-07 DIAGNOSIS — S42291G Other displaced fracture of upper end of right humerus, subsequent encounter for fracture with delayed healing: Secondary | ICD-10-CM | POA: Diagnosis not present

## 2015-03-07 DIAGNOSIS — Y92410 Unspecified street and highway as the place of occurrence of the external cause: Secondary | ICD-10-CM | POA: Diagnosis not present

## 2015-03-07 DIAGNOSIS — Y9389 Activity, other specified: Secondary | ICD-10-CM | POA: Diagnosis not present

## 2015-03-07 MED ORDER — OXYCODONE-ACETAMINOPHEN 5-325 MG PO TABS: 1.0000 | ORAL_TABLET | ORAL | Status: DC | PRN

## 2015-03-07 MED ORDER — OXYCODONE-ACETAMINOPHEN 5-325 MG PO TABS
2.0000 | ORAL_TABLET | Freq: Once | ORAL | Status: AC
  Administered 2015-03-07: 2 via ORAL

## 2015-03-07 MED ORDER — OXYCODONE-ACETAMINOPHEN 5-325 MG PO TABS
ORAL_TABLET | ORAL | Status: AC
  Administered 2015-03-07: 2 via ORAL
  Filled 2015-03-07: qty 2
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
## 2015-03-07 NOTE — Discharge Instructions (Signed)
Humerus Fracture, Treated with Immobilization  The humerus is the large bone in your upper arm. You have a broken (fractured) humerus. These fractures are easily diagnosed with X-rays.  TREATMENT   Simple fractures which will heal without disability are treated with simple immobilization. Immobilization means you will wear a cast, splint, or sling. You have a fracture which will do well with immobilization. The fracture will heal well simply by being held in a good position until it is stable enough to begin range of motion exercises. Do not take part in activities which would further injure your arm.   HOME CARE INSTRUCTIONS    Put ice on the injured area.   Put ice in a plastic bag.   Place a towel between your skin and the bag.   Leave the ice on for 15-20 minutes, 03-04 times a day.   If you have a cast:   Do not scratch the skin under the cast using sharp or pointed objects.   Check the skin around the cast every day. You may put lotion on any red or sore areas.   Keep your cast dry and clean.   If you have a splint:   Wear the splint as directed.   Keep your splint dry and clean.   You may loosen the elastic around the splint if your fingers become numb, tingle, or turn cold or blue.   If you have a sling:   Wear the sling as directed.   Do not put pressure on any part of your cast or splint until it is fully hardened.   Your cast or splint can be protected during bathing with a plastic bag. Do not lower the cast or splint into water.   Only take over-the-counter or prescription medicines for pain, discomfort, or fever as directed by your caregiver.   Do range of motion exercises as instructed by your caregiver.   Follow up as directed by your caregiver. This is very important in order to avoid permanent injury or disability and chronic pain.  SEEK IMMEDIATE MEDICAL CARE IF:    Your skin or nails in the injured arm turn blue or gray.   Your arm feels cold or numb.   You develop severe  pain in the injured arm.   You are having problems with the medicines you were given.  MAKE SURE YOU:    Understand these instructions.   Will watch your condition.   Will get help right away if you are not doing well or get worse.  Document Released: 10/06/2000 Document Revised: 09/22/2011 Document Reviewed: 08/14/2010  ExitCare Patient Information 2015 [employer]. This information is not intended to replace advice given to you by your health care provider. Make sure you discuss any questions you have with your health care provider.
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
## 2015-03-12 ENCOUNTER — Other Ambulatory Visit: Payer: Medicaid Other

## 2015-03-12 ENCOUNTER — Encounter: Payer: Self-pay | Admitting: *Deleted

## 2015-03-12 NOTE — Patient Instructions (Signed)
  Your procedure is scheduled on:03-13-15 Report to [facility] SAME DAY SURGERY 2ND FLOOR @ 8:15-PT AWARE OF TIME   Remember: Instructions that are not followed completely may result in serious medical risk, up to and including death, or upon the discretion of your surgeon and anesthesiologist your surgery may need to be rescheduled.    _X___ 1. Do not eat food or drink liquids after midnight. No gum chewing or hard candies.     _X___ 2. No Alcohol for 24 hours before or after surgery.   ____ 3. Bring all medications with you on the day of surgery if instructed.    ____ 4. Notify your doctor if there is any change in your medical condition     (cold, fever, infections).     Do not wear jewelry, make-up, hairpins, clips or nail polish.  Do not wear lotions, powders, or perfumes. You may wear deodorant.  Do not shave 48 hours prior to surgery. Men may shave face and neck.  Do not bring valuables to the hospital.    [facility] is not responsible for any belongings or valuables.               Contacts, dentures or bridgework may not be worn into surgery.  Leave your suitcase in the car. After surgery it may be brought to your room.  For patients admitted to the hospital, discharge time is determined by your treatment team.   Patients discharged the day of surgery will not be allowed to drive home.   Please read over the following fact sheets that you were given:      _X___ Take these medicines the morning of surgery with A SIP OF WATER:    1. ZANTAC  2. MAY TAKE PERCOCET IF NEEDED SMALL SIP OF WATER  3.   4.  5.  6.  ____ Fleet Enema (as directed)   ____ Use CHG Soap as directed  ____ Use inhalers on the day of surgery  ____ Stop metformin 2 days prior to surgery    ____ Take 1/2 of usual insulin dose the night before surgery and none on the morning of surgery.   ____ Stop Coumadin/Plavix/aspirin-N/A  ____ Stop Anti-inflammatories-NO NSAIDS OR ASA PRODUCTS-PERCOCET  OK   ____ Stop supplements until after surgery.    ____ Bring C-Pap to the hospital.

## 2015-03-13 ENCOUNTER — Ambulatory Visit: Payer: Medicaid Other

## 2015-03-13 ENCOUNTER — Observation Stay
Admission: RE | Admit: 2015-03-13 | Discharge: 2015-03-14 | Disposition: A | Discharge status: Home / Self Care | Payer: Medicaid Other | Source: Ambulatory Visit | Attending: Surgery | Admitting: Surgery

## 2015-03-13 ENCOUNTER — Encounter
Admission: RE | Disposition: A | Discharge status: Home / Self Care | Payer: Self-pay | Source: Ambulatory Visit | Attending: Surgery

## 2015-03-13 ENCOUNTER — Ambulatory Visit: Payer: Medicaid Other | Admitting: Anesthesiology

## 2015-03-13 ENCOUNTER — Encounter: Payer: Self-pay | Admitting: *Deleted

## 2015-03-13 DIAGNOSIS — Z888 Allergy status to other drugs, medicaments and biological substances status: Secondary | ICD-10-CM | POA: Diagnosis not present

## 2015-03-13 DIAGNOSIS — S42209A Unspecified fracture of upper end of unspecified humerus, initial encounter for closed fracture: Secondary | ICD-10-CM | POA: Diagnosis present

## 2015-03-13 DIAGNOSIS — Z8669 Personal history of other diseases of the nervous system and sense organs: Secondary | ICD-10-CM | POA: Insufficient documentation

## 2015-03-13 DIAGNOSIS — Z885 Allergy status to narcotic agent status: Secondary | ICD-10-CM | POA: Insufficient documentation

## 2015-03-13 DIAGNOSIS — Z419 Encounter for procedure for purposes other than remedying health state, unspecified: Secondary | ICD-10-CM

## 2015-03-13 DIAGNOSIS — K219 Gastro-esophageal reflux disease without esophagitis: Secondary | ICD-10-CM | POA: Diagnosis not present

## 2015-03-13 DIAGNOSIS — S42221A 2-part displaced fracture of surgical neck of right humerus, initial encounter for closed fracture: Principal | ICD-10-CM | POA: Insufficient documentation

## 2015-03-13 DIAGNOSIS — Z79891 Long term (current) use of opiate analgesic: Secondary | ICD-10-CM | POA: Insufficient documentation

## 2015-03-13 DIAGNOSIS — Z79899 Other long term (current) drug therapy: Secondary | ICD-10-CM | POA: Diagnosis not present

## 2015-03-13 HISTORY — DX: Gastro-esophageal reflux disease without esophagitis: K21.9

## 2015-03-13 HISTORY — DX: Headache, unspecified: R51.9

## 2015-03-13 HISTORY — DX: Anxiety disorder, unspecified: F41.9

## 2015-03-13 HISTORY — PX: ORIF HUMERUS FRACTURE: SHX2126

## 2015-03-13 HISTORY — DX: Unspecified convulsions: R56.9

## 2015-03-13 HISTORY — DX: Depression, unspecified: F32.A

## 2015-03-13 HISTORY — DX: Headache: R51

## 2015-03-13 HISTORY — DX: Major depressive disorder, single episode, unspecified: F32.9

## 2015-03-13 LAB — TYPE AND SCREEN
ABO/RH(D): O POS
Antibody Screen: NEGATIVE

## 2015-03-13 LAB — ABO/RH: ABO/RH(D): O POS

## 2015-03-13 SURGERY — OPEN REDUCTION INTERNAL FIXATION (ORIF) PROXIMAL HUMERUS FRACTURE
Anesthesia: Regional | Site: Shoulder | Laterality: Right | Wound class: Clean

## 2015-03-13 MED ORDER — ENOXAPARIN SODIUM 40 MG/0.4ML ~~LOC~~ SOLN
40.0000 mg | SUBCUTANEOUS | Status: DC
  Administered 2015-03-14: 40 mg via SUBCUTANEOUS
  Filled 2015-03-13: qty 0.4

## 2015-03-13 MED ORDER — DOCUSATE SODIUM 100 MG PO CAPS
100.0000 mg | ORAL_CAPSULE | Freq: Two times a day (BID) | ORAL | Status: DC
  Administered 2015-03-13 – 2015-03-14 (×3): 100 mg via ORAL
  Filled 2015-03-13 (×3): qty 1

## 2015-03-13 MED ORDER — CEFAZOLIN SODIUM-DEXTROSE 2-3 GM-% IV SOLR
2.0000 g | Freq: Four times a day (QID) | INTRAVENOUS | Status: AC
  Administered 2015-03-13 – 2015-03-14 (×3): 2 g via INTRAVENOUS
  Filled 2015-03-13 (×3): qty 50

## 2015-03-13 MED ORDER — SUGAMMADEX SODIUM 500 MG/5ML IV SOLN
INTRAVENOUS | Status: DC | PRN
  Administered 2015-03-13: 120 mg via INTRAVENOUS

## 2015-03-13 MED ORDER — KCL IN DEXTROSE-NACL 20-5-0.9 MEQ/L-%-% IV SOLN
INTRAVENOUS | Status: DC
  Administered 2015-03-13 – 2015-03-14 (×2): via INTRAVENOUS
  Filled 2015-03-13 (×5): qty 1000

## 2015-03-13 MED ORDER — BUPIVACAINE HCL (PF) 0.5 % IJ SOLN
INTRAMUSCULAR | Status: DC | PRN
  Administered 2015-03-13: 9 mL

## 2015-03-13 MED ORDER — MIDAZOLAM HCL 5 MG/5ML IJ SOLN
INTRAMUSCULAR | Status: AC
  Administered 2015-03-13: 2 mg via INTRAVENOUS
  Filled 2015-03-13: qty 5

## 2015-03-13 MED ORDER — NEOMYCIN-POLYMYXIN B GU 40-200000 IR SOLN
Status: AC
  Filled 2015-03-13: qty 4

## 2015-03-13 MED ORDER — FENTANYL CITRATE (PF) 100 MCG/2ML IJ SOLN
INTRAMUSCULAR | Status: AC
  Administered 2015-03-13: 50 ug via INTRAVENOUS
  Filled 2015-03-13: qty 2

## 2015-03-13 MED ORDER — FENTANYL CITRATE (PF) 100 MCG/2ML IJ SOLN
25.0000 ug | INTRAMUSCULAR | Status: AC | PRN
  Administered 2015-03-13 (×6): 25 ug via INTRAVENOUS

## 2015-03-13 MED ORDER — LACTATED RINGERS IV SOLN
INTRAVENOUS | Status: DC
  Administered 2015-03-13: 09:00:00 via INTRAVENOUS

## 2015-03-13 MED ORDER — KETOROLAC TROMETHAMINE 30 MG/ML IJ SOLN
INTRAMUSCULAR | Status: DC | PRN
  Administered 2015-03-13: 30 mg via INTRAVENOUS

## 2015-03-13 MED ORDER — ONDANSETRON HCL 4 MG/2ML IJ SOLN
INTRAMUSCULAR | Status: DC | PRN
  Administered 2015-03-13: 4 mg via INTRAVENOUS

## 2015-03-13 MED ORDER — PANTOPRAZOLE SODIUM 40 MG PO TBEC
40.0000 mg | DELAYED_RELEASE_TABLET | Freq: Two times a day (BID) | ORAL | Status: DC
  Administered 2015-03-13 – 2015-03-14 (×3): 40 mg via ORAL
  Filled 2015-03-13 (×3): qty 1

## 2015-03-13 MED ORDER — MAGNESIUM HYDROXIDE 400 MG/5ML PO SUSP: 30.0000 mL | Freq: Every day | ORAL | Status: DC | PRN

## 2015-03-13 MED ORDER — EPINEPHRINE HCL 1 MG/ML IJ SOLN
INTRAMUSCULAR | Status: AC
  Filled 2015-03-13: qty 1

## 2015-03-13 MED ORDER — HYDROMORPHONE HCL 1 MG/ML IJ SOLN: 1.0000 mg | INTRAMUSCULAR | Status: DC | PRN

## 2015-03-13 MED ORDER — MIDAZOLAM HCL 5 MG/5ML IJ SOLN
2.0000 mg | Freq: Once | INTRAMUSCULAR | Status: AC
  Administered 2015-03-13: 2 mg via INTRAVENOUS
  Filled 2015-03-13: qty 2

## 2015-03-13 MED ORDER — FENTANYL CITRATE (PF) 100 MCG/2ML IJ SOLN
INTRAMUSCULAR | Status: AC
  Administered 2015-03-13: 25 ug via INTRAVENOUS
  Filled 2015-03-13: qty 2

## 2015-03-13 MED ORDER — BUPIVACAINE-EPINEPHRINE (PF) 0.5% -1:200000 IJ SOLN
INTRAMUSCULAR | Status: AC
  Filled 2015-03-13: qty 30

## 2015-03-13 MED ORDER — ACETAMINOPHEN 325 MG PO TABS: 650.0000 mg | ORAL_TABLET | Freq: Four times a day (QID) | ORAL | Status: DC | PRN

## 2015-03-13 MED ORDER — ONDANSETRON HCL 4 MG/2ML IJ SOLN: 4.0000 mg | Freq: Once | INTRAMUSCULAR | Status: DC | PRN

## 2015-03-13 MED ORDER — FENTANYL CITRATE (PF) 100 MCG/2ML IJ SOLN
INTRAMUSCULAR | Status: AC
Start: 2015-03-13 — End: 2015-03-13
  Administered 2015-03-13: 25 ug via INTRAVENOUS
  Filled 2015-03-13: qty 2

## 2015-03-13 MED ORDER — OXYCODONE HCL 5 MG PO TABS
5.0000 mg | ORAL_TABLET | ORAL | Status: DC | PRN
  Administered 2015-03-13 (×2): 5 mg via ORAL
  Administered 2015-03-14: 10 mg via ORAL
  Administered 2015-03-14: 5 mg via ORAL
  Filled 2015-03-13 (×3): qty 1
  Filled 2015-03-13: qty 2

## 2015-03-13 MED ORDER — BISACODYL 10 MG RE SUPP: 10.0000 mg | Freq: Every day | RECTAL | Status: DC | PRN

## 2015-03-13 MED ORDER — NEOMYCIN-POLYMYXIN B GU 40-200000 IR SOLN
Status: DC | PRN
  Administered 2015-03-13: 4 mL

## 2015-03-13 MED ORDER — ONDANSETRON HCL 4 MG PO TABS: 4.0000 mg | ORAL_TABLET | Freq: Four times a day (QID) | ORAL | Status: DC | PRN

## 2015-03-13 MED ORDER — MIDAZOLAM HCL 2 MG/2ML IJ SOLN
INTRAMUSCULAR | Status: DC | PRN
  Administered 2015-03-13: 1 mg via INTRAVENOUS

## 2015-03-13 MED ORDER — METOCLOPRAMIDE HCL 5 MG/ML IJ SOLN: 5.0000 mg | Freq: Three times a day (TID) | INTRAMUSCULAR | Status: DC | PRN

## 2015-03-13 MED ORDER — METOCLOPRAMIDE HCL 5 MG PO TABS: 5.0000 mg | ORAL_TABLET | Freq: Three times a day (TID) | ORAL | Status: DC | PRN

## 2015-03-13 MED ORDER — DIPHENHYDRAMINE HCL 12.5 MG/5ML PO ELIX: 12.5000 mg | ORAL_SOLUTION | ORAL | Status: DC | PRN

## 2015-03-13 MED ORDER — ROPIVACAINE HCL 5 MG/ML IJ SOLN
INTRAMUSCULAR | Status: AC
  Administered 2015-03-13: 30 mL via EPIDURAL
  Filled 2015-03-13: qty 40

## 2015-03-13 MED ORDER — FENTANYL CITRATE (PF) 100 MCG/2ML IJ SOLN
INTRAMUSCULAR | Status: DC | PRN
  Administered 2015-03-13: 100 ug via INTRAVENOUS

## 2015-03-13 MED ORDER — BUPIVACAINE HCL (PF) 0.5 % IJ SOLN
INTRAMUSCULAR | Status: AC
  Filled 2015-03-13: qty 30

## 2015-03-13 MED ORDER — KETOROLAC TROMETHAMINE 15 MG/ML IJ SOLN
15.0000 mg | Freq: Four times a day (QID) | INTRAMUSCULAR | Status: DC
  Administered 2015-03-13 – 2015-03-14 (×3): 15 mg via INTRAVENOUS
  Filled 2015-03-13 (×7): qty 1

## 2015-03-13 MED ORDER — ROCURONIUM BROMIDE 100 MG/10ML IV SOLN
INTRAVENOUS | Status: DC | PRN
  Administered 2015-03-13: 10 mg via INTRAVENOUS
  Administered 2015-03-13: 40 mg via INTRAVENOUS
  Administered 2015-03-13: 10 mg via INTRAVENOUS
  Administered 2015-03-13: 20 mg via INTRAVENOUS

## 2015-03-13 MED ORDER — ACETAMINOPHEN 650 MG RE SUPP: 650.0000 mg | Freq: Four times a day (QID) | RECTAL | Status: DC | PRN

## 2015-03-13 MED ORDER — LIDOCAINE HCL (PF) 1 % IJ SOLN
INTRAMUSCULAR | Status: AC
  Filled 2015-03-13: qty 5

## 2015-03-13 MED ORDER — NICOTINE 10 MG IN INHA
1.0000 | RESPIRATORY_TRACT | Status: DC | PRN
  Administered 2015-03-13: 1 via RESPIRATORY_TRACT
  Filled 2015-03-13: qty 36

## 2015-03-13 MED ORDER — FLEET ENEMA 7-19 GM/118ML RE ENEM: 1.0000 | ENEMA | Freq: Once | RECTAL | Status: DC | PRN

## 2015-03-13 MED ORDER — CEFAZOLIN SODIUM-DEXTROSE 2-3 GM-% IV SOLR
INTRAVENOUS | Status: AC
  Filled 2015-03-13: qty 50

## 2015-03-13 MED ORDER — FENTANYL CITRATE (PF) 100 MCG/2ML IJ SOLN
50.0000 ug | Freq: Once | INTRAMUSCULAR | Status: AC
  Administered 2015-03-13: 50 ug via INTRAVENOUS
  Filled 2015-03-13: qty 1

## 2015-03-13 MED ORDER — SODIUM CHLORIDE 0.9 % IV BOLUS (SEPSIS)
500.0000 mL | INTRAVENOUS | Status: AC
  Administered 2015-03-13: 500 mL via INTRAVENOUS

## 2015-03-13 MED ORDER — ONDANSETRON HCL 4 MG/2ML IJ SOLN: 4.0000 mg | Freq: Four times a day (QID) | INTRAMUSCULAR | Status: DC | PRN

## 2015-03-13 MED ORDER — PROPOFOL 10 MG/ML IV BOLUS
INTRAVENOUS | Status: DC | PRN
  Administered 2015-03-13: 150 mg via INTRAVENOUS

## 2015-03-13 MED ORDER — LIDOCAINE HCL (CARDIAC) 20 MG/ML IV SOLN
INTRAVENOUS | Status: DC | PRN
  Administered 2015-03-13: 10 mg via INTRAVENOUS

## 2015-03-13 MED ORDER — CEFAZOLIN SODIUM-DEXTROSE 2-3 GM-% IV SOLR
2.0000 g | Freq: Once | INTRAVENOUS | Status: AC
  Administered 2015-03-13: 2 g via INTRAVENOUS

## 2015-03-13 SURGICAL SUPPLY — 54 items
BANDAGE ELASTIC 4 CLIP ST LF (GAUZE/BANDAGES/DRESSINGS) ×6 IMPLANT
BNDG COHESIVE 4X5 TAN STRL (GAUZE/BANDAGES/DRESSINGS) ×3 IMPLANT
CANISTER SUCT 1200ML W/VALVE (MISCELLANEOUS) ×3 IMPLANT
CHLORAPREP W/TINT 26ML (MISCELLANEOUS) ×4 IMPLANT
DRAPE C-ARM XRAY 36X54 (DRAPES) ×5 IMPLANT
DRAPE INCISE IOBAN 66X45 STRL (DRAPES) ×6 IMPLANT
DRAPE U-SHAPE 47X51 STRL (DRAPES) ×3 IMPLANT
DRSG OPSITE POSTOP 4X8 (GAUZE/BANDAGES/DRESSINGS) ×3 IMPLANT
ELECT CAUTERY BLADE 6.4 (BLADE) ×3 IMPLANT
GAUZE PETRO XEROFOAM 1X8 (MISCELLANEOUS) ×3 IMPLANT
GAUZE SPONGE 4X4 12PLY STRL (GAUZE/BANDAGES/DRESSINGS) ×3 IMPLANT
GLOVE BIO SURGEON STRL SZ7.5 (GLOVE) ×6 IMPLANT
GLOVE BIO SURGEON STRL SZ8 (GLOVE) ×6 IMPLANT
GLOVE BIOGEL PI IND STRL 8 (GLOVE) ×1 IMPLANT
GLOVE BIOGEL PI INDICATOR 8 (GLOVE) ×2
GLOVE INDICATOR 8.0 STRL GRN (GLOVE) ×3 IMPLANT
GOWN STRL REUS W/ TWL LRG LVL3 (GOWN DISPOSABLE) ×2 IMPLANT
GOWN STRL REUS W/ TWL XL LVL3 (GOWN DISPOSABLE) ×1 IMPLANT
GOWN STRL REUS W/TWL LRG LVL3 (GOWN DISPOSABLE) ×6
GOWN STRL REUS W/TWL XL LVL3 (GOWN DISPOSABLE) ×3
HANDLE YANKAUER SUCT BULB TIP (MISCELLANEOUS) ×3 IMPLANT
KIT RM TURNOVER STRD PROC AR (KITS) ×3 IMPLANT
KIT STABILIZATION SHOULDER (MISCELLANEOUS) ×3 IMPLANT
MASK FACE SPIDER DISP (MASK) ×3 IMPLANT
NDL HYPO 25X1 1.5 SAFETY (NEEDLE) ×1 IMPLANT
NEEDLE HYPO 25X1 1.5 SAFETY (NEEDLE) ×3 IMPLANT
NS IRRIG 1000ML POUR BTL (IV SOLUTION) ×3 IMPLANT
PACK ARTHROSCOPY SHOULDER (MISCELLANEOUS) ×3 IMPLANT
PAD CAST CTTN 4X4 STRL (SOFTGOODS) ×2 IMPLANT
PAD GROUND ADULT SPLIT (MISCELLANEOUS) ×3 IMPLANT
PADDING CAST COTTON 4X4 STRL (SOFTGOODS) ×6
PEG STND 4.0X30MM (Orthopedic Implant) ×3 IMPLANT
PEG STND 4.0X40MM (Orthopedic Implant) ×3 IMPLANT
PEG THREADED 4.0X30.0MM (Orthopedic Implant) ×4 IMPLANT
PEG THREADED 4.0X35.0MM (Orthopedic Implant) ×4 IMPLANT
PEG THREADED 4.0X40.0MM (Orthopedic Implant) ×2 IMPLANT
PEG THREADED 4.0X45.0MM (Orthopedic Implant) ×2 IMPLANT
PEGSTD 4.0X30MM (Orthopedic Implant) IMPLANT
PEGSTD 4.0X40MM (Orthopedic Implant) IMPLANT
PLATE SHOULDER S3 3HOLE RT (Plate) ×2 IMPLANT
SCREW MULTIDIR 3.8X26 HUMRL (Screw) ×2 IMPLANT
SCREW MULTIDIR 3.8X32 HUMRL (Screw) ×2 IMPLANT
SCREW MULTIDIR 3.8X34 HUMRL (Screw) ×2 IMPLANT
SCREW MULTIDIR SS 3.8X28 HUMRL (Screw) ×2 IMPLANT
SLING ARM LRG DEEP (SOFTGOODS) ×3 IMPLANT
SLING ARM M TX990204 (SOFTGOODS) ×1 IMPLANT
STAPLER SKIN PROX 35W (STAPLE) ×3 IMPLANT
STOCKINETTE IMPERVIOUS 9X36 MD (GAUZE/BANDAGES/DRESSINGS) ×3 IMPLANT
SUT FIBERWIRE #2 38 BLUE 1/2 (SUTURE) ×12
SUT PROLENE 4 0 PS 2 18 (SUTURE) ×6 IMPLANT
SUT VIC AB 2-0 CT1 27 (SUTURE) ×12
SUT VIC AB 2-0 CT1 TAPERPNT 27 (SUTURE) ×4 IMPLANT
SUTURE FIBERWR #2 38 BLUE 1/2 (SUTURE) ×4 IMPLANT
SYRINGE 10CC LL (SYRINGE) ×3 IMPLANT
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
## 2015-03-13 NOTE — Anesthesia Preprocedure Evaluation (Addendum)
Anesthesia Evaluation  Patient identified by MRN, date of birth, ID band Patient awake    Reviewed: Allergy & Precautions, NPO status , Patient's Chart, lab work & pertinent test results  History of Anesthesia Complications Negative for: history of anesthetic complications  Airway Mallampati: II  TM Distance: >3 FB Neck ROM: Full    Dental  (+) Teeth Intact   Pulmonary Current Smoker (1/2 ppd),          Cardiovascular     Neuro/Psych Seizures - (last 1 yr ago, pt states the seizures "did not register"),  Anxiety Depression    GI/Hepatic GERD-  Medicated and Controlled,  Endo/Other    Renal/GU      Musculoskeletal   Abdominal   Peds  Hematology   Anesthesia Other Findings   Reproductive/Obstetrics                            Anesthesia Physical Anesthesia Plan  ASA: II  Anesthesia Plan: General and Regional   Post-op Pain Management: MAC Combined w/ Regional for Post-op pain   Induction: Intravenous  Airway Management Planned: Oral ETT  Additional Equipment:   Intra-op Plan:   Post-operative Plan:   Informed Consent: I have reviewed the patients History and Physical, chart, labs and discussed the procedure including the risks, benefits and alternatives for the proposed anesthesia with the patient or authorized representative who has indicated his/her understanding and acceptance.     Plan Discussed with:   Anesthesia Plan Comments:         Anesthesia Quick Evaluation

## 2015-03-13 NOTE — Anesthesia Postprocedure Evaluation (Signed)
  Anesthesia Post-op Note  Patient: Krystal [name]  Procedure(s) Performed: Procedure(s): OPEN REDUCTION INTERNAL FIXATION (ORIF) PROXIMAL HUMERUS FRACTURE (Right)  Anesthesia type:General, Regional  Patient location: PACU  Post pain: Pain level controlled  Post assessment: Post-op Vital signs reviewed, Patient's Cardiovascular Status Stable, Respiratory Function Stable, Patent Airway and No signs of Nausea or vomiting  Post vital signs: Reviewed and stable  Last Vitals:  Filed Vitals:   03/13/15 1617  BP: 95/56  Pulse: 63  Temp: 36.5 C  Resp: 18    Level of consciousness: awake, alert  and patient cooperative  Complications: No apparent anesthesia complications

## 2015-03-13 NOTE — Transfer of Care (Signed)
Immediate Anesthesia Transfer of Care Note  Patient: Krystal [name]  Procedure(s) Performed: Procedure(s): OPEN REDUCTION INTERNAL FIXATION (ORIF) PROXIMAL HUMERUS FRACTURE (Right)  Patient Location: PACU  Anesthesia Type:General  Level of Consciousness: awake, alert  and oriented  Airway & Oxygen Therapy: Patient Spontanous Breathing and Patient connected to nasal cannula oxygen  Post-op Assessment: Report given to RN and Post -op Vital signs reviewed and stable  Post vital signs: Reviewed and stable  Last Vitals:  Filed Vitals:   03/13/15 1223  BP: 118/78  Pulse: 86  Temp: 36.4 C  Resp: 18    Complications: No apparent anesthesia complications

## 2015-03-13 NOTE — Progress Notes (Signed)
Called Dr Joice Lofts regarding patients Hypotension orders received and initiated.

## 2015-03-13 NOTE — H&P (Signed)
Paper H&P to be scanned into permanent record. H&P reviewed. No changes. 

## 2015-03-13 NOTE — Anesthesia Procedure Notes (Addendum)
Anesthesia Regional Block:  Interscalene brachial plexus block  Pre-Anesthetic Checklist: ,, timeout performed, Correct Patient, Correct Site, Correct Laterality, Correct Procedure, Correct Position, site marked, Risks and benefits discussed,  Surgical consent,  Pre-op evaluation,  At surgeon's request and post-op pain management  Laterality: Right  Prep: Betadine       Needles:  Injection technique: Single-shot  Needle Type: Stimiplex     Needle Length: 5cm 5 cm Needle Gauge: 20 and 20 G    Additional Needles:  Procedures: ultrasound guided (picture in chart) Interscalene brachial plexus block  Nerve Stimulator or Paresthesia:  Response: BICEPS, 0.8 mA,   Additional Responses:   Narrative:  Start time: 03/13/2015 9:20 AM End time: 03/13/2015 9:35 AM  Performed by: Personally  Anesthesiologist: Naomie Dean   Procedure Name: Intubation Date/Time: 03/13/2015 9:58 AM Performed by: Omer Jack Pre-anesthesia Checklist: Patient identified, Patient being monitored, Timeout performed, Emergency Drugs available and Suction available Patient Re-evaluated:Patient Re-evaluated prior to inductionOxygen Delivery Method: Circle system utilized Preoxygenation: Pre-oxygenation with 100% oxygen Intubation Type: IV induction Ventilation: Mask ventilation without difficulty Laryngoscope Size: Mac and 3 Grade View: Grade I Tube type: Oral Tube size: 7.0 mm Number of attempts: 1 Placement Confirmation: ETT inserted through vocal cords under direct vision,  positive ETCO2 and breath sounds checked- equal and bilateral Secured at: 21 cm Tube secured with: Tape Dental Injury: Teeth and Oropharynx as per pre-operative assessment

## 2015-03-14 DIAGNOSIS — S42221A 2-part displaced fracture of surgical neck of right humerus, initial encounter for closed fracture: Secondary | ICD-10-CM | POA: Diagnosis not present

## 2015-03-14 LAB — CREATININE, SERUM
CREATININE: 1.02 mg/dL — AB (ref 0.44–1.00)
GFR calc Af Amer: >60 mL/min (ref >60)
GFR calc non Af Amer: >60 mL/min (ref >60)

## 2015-03-14 MED ORDER — OXYCODONE-ACETAMINOPHEN 5-325 MG PO TABS: 1.0000 | ORAL_TABLET | ORAL | Status: DC | PRN
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
## 2015-03-14 NOTE — Progress Notes (Signed)
  Subjective: 1 Day Post-Op Procedure(s) (LRB): OPEN REDUCTION INTERNAL FIXATION (ORIF) PROXIMAL HUMERUS FRACTURE (Right) Patient reports pain as 8 on 0-10 scale and moderate.   Patient is well, and has had no acute complaints or problems Plan is to go Home after hospital stay. Negative for chest pain and shortness of breath Fever: no Gastrointestinal:Negative for nausea and vomiting  Objective: Vital signs in last 24 hours: Temp:  [97.5 F (36.4 C)-98.7 F (37.1 C)] 97.9 F (36.6 C) (08/31 0734) Pulse Rate:  [55-108] 55 (08/31 0734) Resp:  [13-23] 16 (08/31 0734) BP: (83-123)/(43-81) 112/69 mmHg (08/31 0734) SpO2:  [97 %-100 %] 100 % (08/31 0734) Weight:  [63.504 kg (140 lb)] 63.504 kg (140 lb) (08/30 0823)  Intake/Output from previous day:  Intake/Output Summary (Last 24 hours) at 03/14/15 0814 Last data filed at 03/14/15 0719  Gross per 24 hour  Intake 2553.34 ml  Output     51 ml  Net 2502.34 ml    Intake/Output this shift:    Labs: No results for input(s): HGB in the last 72 hours. No results for input(s): WBC, RBC, HCT, PLT in the last 72 hours.  Recent Labs  03/14/15 0616  CREATININE 1.02*   No results for input(s): LABPT, INR in the last 72 hours.   EXAM General - Patient is Alert, Appropriate and Oriented Extremity - Neurologically intact ABD soft Intact pulses distally Dorsiflexion/Plantar flexion intact Incision: dressing C/D/I Dressing/Incision - clean, dry, no drainage Motor Function - intact, moving foot and toes well on exam.   Abdomen is soft without tympany.  Past Medical History  Diagnosis Date  . Depression   . Anxiety   . GERD (gastroesophageal reflux disease)   . Headache   . Seizures     LAST SEIZURE 2014 OR 2015=-NO MEDS    Assessment/Plan: 1 Day Post-Op Procedure(s) (LRB): OPEN REDUCTION INTERNAL FIXATION (ORIF) PROXIMAL HUMERUS FRACTURE (Right) Active Problems:   Proximal humerus fracture  Estimated body mass index is  22.61 kg/(m^2) as calculated from the following:   Height as of this encounter:  (1.676 m).   Weight as of this encounter: 63.504 kg (140 lb). Advance diet D/C IV fluids  Can d/c home today following d/c of IV fluids and handling PO intake.  DVT Prophylaxis - Lovenox, Foot Pumps and TED hose Non-weight bearing to right upper extremity.  Valeria Batman, PA-C [facility] Orthopaedic Surgery 03/14/2015, 8:14 AM

## 2015-03-14 NOTE — Progress Notes (Signed)
Patient discharged home. Instructions and prescription given to patient, verbalized understanding. IV removed. Sling in place. Family providing transportation.

## 2015-03-14 NOTE — Discharge Instructions (Signed)
Diet: As you were doing prior to hospitalization  ° °Shower:  May shower but keep the wounds dry, use an occlusive plastic wrap, NO SOAKING IN TUB.  If the bandage gets wet, change with a clean dry gauze. ° °Dressing:  You may change your dressing as needed. Change the dressing with sterile gauze dressing.   ° °Activity:  Increase activity slowly as tolerated, but follow the weight bearing instructions below.  No lifting or driving for 6 weeks. ° °Weight Bearing:   Non-weightbearing to the right upper extremity. ° °To prevent constipation: you may use a stool softener such as - ° °Colace (over the counter) 100 mg by mouth twice a day  °Drink plenty of fluids (prune juice may be helpful) and high fiber foods °Miralax (over the counter) for constipation as needed.   ° °Itching:  If you experience itching with your medications, try taking only a single pain pill, or even half a pain pill at a time.  You may take up to 10 pain pills per day, and you can also use benadryl over the counter for itching or also to help with sleep.  ° °Precautions:  If you experience chest pain or shortness of breath - call 911 immediately for transfer to the hospital emergency department!! ° °If you develop a fever greater that 101 F, purulent drainage from wound, increased redness or drainage from wound, or calf pain-Call [facility]                                              °Follow- Up Appointment:  Please call for an appointment to be seen in 2 weeks at [facility] °
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
## 2015-03-16 ENCOUNTER — Encounter: Payer: Self-pay | Admitting: Surgery

## 2017-05-18 ENCOUNTER — Encounter: Payer: Self-pay | Admitting: Emergency Medicine

## 2017-05-18 ENCOUNTER — Other Ambulatory Visit: Payer: Self-pay

## 2017-05-18 ENCOUNTER — Emergency Department
Admission: EM | Admit: 2017-05-18 | Discharge: 2017-05-18 | Disposition: A | Discharge status: Home / Self Care | Payer: Self-pay | Attending: Emergency Medicine | Admitting: Emergency Medicine

## 2017-05-18 DIAGNOSIS — R0982 Postnasal drip: Secondary | ICD-10-CM | POA: Insufficient documentation

## 2017-05-18 DIAGNOSIS — R05 Cough: Secondary | ICD-10-CM | POA: Insufficient documentation

## 2017-05-18 DIAGNOSIS — R079 Chest pain, unspecified: Secondary | ICD-10-CM | POA: Insufficient documentation

## 2017-05-18 DIAGNOSIS — R0981 Nasal congestion: Secondary | ICD-10-CM | POA: Insufficient documentation

## 2017-05-18 DIAGNOSIS — Z5321 Procedure and treatment not carried out due to patient leaving prior to being seen by health care provider: Secondary | ICD-10-CM | POA: Insufficient documentation

## 2017-05-18 NOTE — ED Triage Notes (Signed)
Patient to ER for c/o chest tightness, aching, and burning. States over weekend she began having chest tightness, nasal drainage and congestion, and dry cough.
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
## 2018-02-05 ENCOUNTER — Encounter (HOSPITAL_COMMUNITY): Payer: Self-pay

## 2018-02-05 ENCOUNTER — Emergency Department (HOSPITAL_COMMUNITY)
Admission: EM | Admit: 2018-02-05 | Discharge: 2018-02-05 | Disposition: A | Discharge status: Home / Self Care | Payer: Self-pay | Attending: Emergency Medicine | Admitting: Emergency Medicine

## 2018-02-05 DIAGNOSIS — K047 Periapical abscess without sinus: Secondary | ICD-10-CM | POA: Insufficient documentation

## 2018-02-05 DIAGNOSIS — Z5321 Procedure and treatment not carried out due to patient leaving prior to being seen by health care provider: Secondary | ICD-10-CM | POA: Insufficient documentation

## 2018-02-05 NOTE — ED Triage Notes (Signed)
Pt reports that she had a dental abscess on the R upper side of broken tooth, some fevers

## 2018-02-05 NOTE — ED Notes (Signed)
No answer for vitals check X2

## 2018-02-05 NOTE — ED Notes (Signed)
No answer for Vitals check

## 2018-08-20 ENCOUNTER — Encounter (HOSPITAL_COMMUNITY): Payer: Self-pay | Admitting: Emergency Medicine

## 2018-08-20 ENCOUNTER — Emergency Department (HOSPITAL_COMMUNITY)
Admission: EM | Admit: 2018-08-20 | Discharge: 2018-08-20 | Disposition: A | Discharge status: Home / Self Care | Payer: Self-pay | Attending: Emergency Medicine | Admitting: Emergency Medicine

## 2018-08-20 ENCOUNTER — Other Ambulatory Visit: Payer: Self-pay

## 2018-08-20 DIAGNOSIS — F17210 Nicotine dependence, cigarettes, uncomplicated: Secondary | ICD-10-CM | POA: Insufficient documentation

## 2018-08-20 DIAGNOSIS — Z79899 Other long term (current) drug therapy: Secondary | ICD-10-CM | POA: Insufficient documentation

## 2018-08-20 DIAGNOSIS — K047 Periapical abscess without sinus: Secondary | ICD-10-CM | POA: Insufficient documentation

## 2018-08-20 MED ORDER — CLINDAMYCIN HCL 150 MG PO CAPS
300.0000 mg | ORAL_CAPSULE | Freq: Once | ORAL | Status: AC
  Administered 2018-08-20: 300 mg via ORAL
  Filled 2018-08-20: qty 2

## 2018-08-20 MED ORDER — CLINDAMYCIN HCL 150 MG PO CAPS
300.0000 mg | ORAL_CAPSULE | Freq: Three times a day (TID) | ORAL | 0 refills | Status: AC
Start: 2018-08-20 — End: 2018-08-27

## 2018-08-20 MED ORDER — KETOROLAC TROMETHAMINE 60 MG/2ML IM SOLN
15.0000 mg | Freq: Once | INTRAMUSCULAR | Status: AC
  Administered 2018-08-20: 15 mg via INTRAMUSCULAR
  Filled 2018-08-20: qty 2

## 2018-08-20 MED ORDER — NAPROXEN 500 MG PO TABS: 500.0000 mg | ORAL_TABLET | Freq: Two times a day (BID) | ORAL | 0 refills | Status: AC

## 2018-08-20 NOTE — Discharge Instructions (Addendum)
Thank you for allowing me to care for you today. Please return to the emergency department if you have new or worsening symptoms, specifically worsening facial swelling, fever. Take your medications as instructed.  Apply ice over a towel 3 times a day for 20 minutes at a time

## 2018-08-20 NOTE — ED Provider Notes (Signed)
[facility] EMERGENCY DEPARTMENT Provider Note   CSN: [id number] Arrival date & time: 08/20/18  1343     History   Chief Complaint Chief Complaint  Patient presents with  . Facial Swelling    HPI Krystal [name] is a 43 y.o. female.  Patient is a 43 year old female with no significant past medical history who presents emergency department for left upper dental pain with facial swelling.  Reports that this is been going on for 3 days.  Reports that her mother had some leftover amoxicillin at home states she took about 5 doses of this.  She does not know the strength of it.  She denies any fever but reports that overall she has not been feeling well.  Denies any trouble breathing or swallowing.  Reports that she is trying to get into see a dentist that is affordable since she does not have insurance.     Past Medical History:  Diagnosis Date  . Anxiety   . Depression   . GERD (gastroesophageal reflux disease)   . Headache   . Seizures (HCC)    LAST SEIZURE 2014 OR 2015=-NO MEDS    Patient Active Problem List   Diagnosis Date Noted  . Proximal humerus fracture 03/13/2015    Past Surgical History:  Procedure Laterality Date  . ABDOMINAL HYSTERECTOMY    . CARPAL TUNNEL RELEASE    . hemmorhoidectomy    . ORIF HUMERUS FRACTURE Right 03/13/2015   Procedure: OPEN REDUCTION INTERNAL FIXATION (ORIF) PROXIMAL HUMERUS FRACTURE;  Surgeon: Christena Flake, MD;  Location: [facility] ORS;  Service: Orthopedics;  Laterality: Right;     OB History   No obstetric history on file.      Home Medications    Prior to Admission medications   Medication Sig Start Date End Date Taking? Authorizing Provider  clindamycin (CLEOCIN) 150 MG capsule Take 2 capsules (300 mg total) by mouth 3 (three) times daily for 7 days. 08/20/18 08/27/18  Ronnie Doss A, PA-C  naproxen (NAPROSYN) 500 MG tablet Take 1 tablet (500 mg total) by mouth 2 (two) times daily. 08/20/18   Arlyn Dunning,  PA-C  oxyCODONE-acetaminophen (PERCOCET/ROXICET) 5-325 MG per tablet Take 1-2 tablets by mouth every 4 (four) hours as needed for severe pain. 03/14/15   Anson Oregon, PA-C  ranitidine (ZANTAC) 150 MG capsule Take 300 mg by mouth every morning.    [provider]    Family History No family history on file.  Social History Social History   Tobacco Use  . Smoking status: Current Every Day Smoker    Packs/day: 0.50    Years: 20.00    Pack years: 10.00    Types: Cigarettes  . Smokeless tobacco: Never Used  Substance Use Topics  . Alcohol use: No  . Drug use: No     Allergies   Flexeril [cyclobenzaprine] and Vicodin [hydrocodone-acetaminophen]   Review of Systems Review of Systems  Constitutional: Negative for chills and fever.  HENT: Positive for dental problem. Negative for drooling, ear discharge, ear pain, rhinorrhea, sore throat, trouble swallowing and voice change.   Eyes: Negative for pain and visual disturbance.  Respiratory: Negative for cough and shortness of breath.   Cardiovascular: Negative for chest pain and palpitations.  Gastrointestinal: Negative for abdominal pain and vomiting.  Genitourinary: Negative for dysuria and hematuria.  Musculoskeletal: Negative for arthralgias and back pain.  Skin: Negative for color change and rash.  Neurological: Negative for dizziness, syncope and light-headedness.  All other systems reviewed and are negative.    Physical Exam Updated Vital Signs BP 128/87 (BP Location: Right Arm)   Pulse 91   Temp 98.2 F (36.8 C) (Oral)   Resp 16   SpO2 100%   Physical Exam Vitals signs and nursing note reviewed.  Constitutional:      General: She is not in acute distress.    Appearance: Normal appearance. She is not ill-appearing, toxic-appearing or diaphoretic.  HENT:     Head: Normocephalic and atraumatic.     Nose: Nose normal.     Mouth/Throat:     Mouth: Mucous membranes are moist.     Pharynx:  Oropharynx is clear.      Comments: Patient has poor dentition throughout.  There is any chipped tooth in the upper left side.  There is associated upper gum swelling with swelling into the left maxillary area and mild swelling of the left lower lid.  No pain with eye movement.  Full range of motion of the eyes.  No obvious or palpable abscess.  No obvious erythema or cellulitis of the skin but there is some erythema of the upper left gums. Eyes:     Conjunctiva/sclera: Conjunctivae normal.  Cardiovascular:     Rate and Rhythm: Normal rate and regular rhythm.  Pulmonary:     Effort: Pulmonary effort is normal.  Skin:    General: Skin is dry.  Neurological:     Mental Status: She is alert.  Psychiatric:        Mood and Affect: Mood normal.      ED Treatments / Results  Labs (all labs ordered are listed, but only abnormal results are displayed) Labs Reviewed - No data to display  EKG None  Radiology No results found.  Procedures Procedures (including critical care time)  Medications Ordered in ED Medications  ketorolac (TORADOL) injection 15 mg (15 mg Intramuscular Given 08/20/18 1416)  clindamycin (CLEOCIN) capsule 300 mg (300 mg Oral Given 08/20/18 1416)     Initial Impression / Assessment and Plan / ED Course  I have reviewed the triage vital signs and the nursing notes.  Pertinent labs & imaging results that were available during my care of the patient were reviewed by me and considered in my medical decision making (see chart for details).  Clinical Course as of Aug 21 1415  Fri Aug 20, 2018  1410 Patient has chipped tooth with gum swelling.  Likely has a dental abscess but this is not obvious on exam.  Going to start her on clindamycin and give her a shot of Toradol.  She is already tried a few doses of amoxicillin, unknown strength, at home.  She is trying to get into a dentist currently.  She was given a good Rx coupon for her prescription.  She was advised on  returning to the emergency department in 2 days for recheck or sooner if anything worsens.   [KM]    Clinical Course User Index [KM] Arlyn Dunning, PA-C   Based on review of vitals, medical screening exam, lab work and/or imaging, there does not appear to be an acute, emergent etiology for the patient's symptoms. Counseled pt on good return precautions and encouraged both PCP and ED follow-up as needed.  Prior to discharge, I also discussed incidental imaging findings with patient in detail and advised appropriate, recommended follow-up in detail.  Clinical Impression: 1. Dental infection     Disposition: Discharge   This note was prepared with  assistance of Conservation officer, historic buildingsDragon voice recognition software. Occasional wrong-word or sound-a-like substitutions may have occurred due to the inherent limitations of voice recognition software.   Final Clinical Impressions(s) / ED Diagnoses   Final diagnoses:  Dental infection    ED Discharge Orders         Ordered    clindamycin (CLEOCIN) 150 MG capsule  3 times daily     08/20/18 1414    naproxen (NAPROSYN) 500 MG tablet  2 times daily     08/20/18 1414           Jeral PinchMcLean, Jeannine Pennisi A, PA-C 08/20/18 1417    Tilden Fossaees, Elizabeth, MD 08/20/18 [phone]

## 2018-08-20 NOTE — ED Triage Notes (Addendum)
Left upper tooth pain x 3 days states is making her eye swell, states she took some of her moms antibiotics but they did not help

## 2021-05-22 ENCOUNTER — Emergency Department (HOSPITAL_COMMUNITY)
Admission: EM | Admit: 2021-05-22 | Discharge: 2021-05-22 | Disposition: A | Discharge status: Home / Self Care | Payer: Self-pay | Attending: Emergency Medicine | Admitting: Emergency Medicine

## 2021-05-22 ENCOUNTER — Encounter (HOSPITAL_COMMUNITY): Payer: Self-pay

## 2021-05-22 ENCOUNTER — Other Ambulatory Visit: Payer: Self-pay

## 2021-05-22 DIAGNOSIS — W228XXA Striking against or struck by other objects, initial encounter: Secondary | ICD-10-CM | POA: Insufficient documentation

## 2021-05-22 DIAGNOSIS — F17210 Nicotine dependence, cigarettes, uncomplicated: Secondary | ICD-10-CM | POA: Insufficient documentation

## 2021-05-22 DIAGNOSIS — S060X0A Concussion without loss of consciousness, initial encounter: Secondary | ICD-10-CM | POA: Insufficient documentation

## 2021-05-22 MED ORDER — ONDANSETRON 4 MG PO TBDP
8.0000 mg | ORAL_TABLET | Freq: Once | ORAL | Status: AC
  Administered 2021-05-22: 06:00:00 8 mg via ORAL
  Filled 2021-05-22: qty 2

## 2021-05-22 MED ORDER — ACETAMINOPHEN 325 MG PO TABS
650.0000 mg | ORAL_TABLET | Freq: Once | ORAL | Status: AC
  Administered 2021-05-22: 06:00:00 650 mg via ORAL
  Filled 2021-05-22: qty 2

## 2021-05-22 NOTE — ED Provider Notes (Signed)
[facility] EMERGENCY DEPARTMENT Provider Note   CSN: [id number] Arrival date & time: 05/22/21  0446     History Chief Complaint  Patient presents with   Headache    Krystal [name] is a 45 y.o. female.  The history is provided by the patient.  Headache Pain location:  Generalized Quality:  Sharp Radiates to:  Does not radiate Onset quality:  Sudden Duration:  7 hours Timing:  Constant Progression:  Unchanged Chronicity:  New Relieved by:  Nothing Worsened by:  Nothing Associated symptoms: dizziness and nausea   Associated symptoms: no focal weakness and no vomiting   Patient presents after head injury.  Patient reports an approximate 11 PM she hit her head on the dresser.  Denies LOC.  She reports nausea without vomiting.  She reports mild dizziness that is improving.  She reports she had blurred vision but that is improved.  No vision loss.  No focal weakness    Past Medical History:  Diagnosis Date   Anxiety    Depression    GERD (gastroesophageal reflux disease)    Headache    Seizures (HCC)    LAST SEIZURE 2014 OR 2015=-NO MEDS    Patient Active Problem List   Diagnosis Date Noted   Proximal humerus fracture 03/13/2015    Past Surgical History:  Procedure Laterality Date   ABDOMINAL HYSTERECTOMY     CARPAL TUNNEL RELEASE     hemmorhoidectomy     ORIF HUMERUS FRACTURE Right 03/13/2015   Procedure: OPEN REDUCTION INTERNAL FIXATION (ORIF) PROXIMAL HUMERUS FRACTURE;  Surgeon: Christena Flake, MD;  Location: [facility] ORS;  Service: Orthopedics;  Laterality: Right;   SHOULDER ARTHROSCOPY Right      OB History   No obstetric history on file.     No family history on file.  Social History   Tobacco Use   Smoking status: Every Day    Packs/day: 0.50    Years: 20.00    Pack years: 10.00    Types: Cigarettes   Smokeless tobacco: Never  Substance Use Topics   Alcohol use: No   Drug use: No    Home Medications Prior to Admission  medications   Medication Sig Start Date End Date Taking? Authorizing Provider  naproxen (NAPROSYN) 500 MG tablet Take 1 tablet (500 mg total) by mouth 2 (two) times daily. 08/20/18   Arlyn Dunning, PA-C  oxyCODONE-acetaminophen (PERCOCET/ROXICET) 5-325 MG per tablet Take 1-2 tablets by mouth every 4 (four) hours as needed for severe pain. 03/14/15   Anson Oregon, PA-C  ranitidine (ZANTAC) 150 MG capsule Take 300 mg by mouth every morning.    [provider]    Allergies    Flexeril [cyclobenzaprine] and Vicodin [hydrocodone-acetaminophen]  Review of Systems   Review of Systems  Eyes:  Positive for visual disturbance.  Gastrointestinal:  Positive for nausea. Negative for vomiting.  Neurological:  Positive for dizziness and headaches. Negative for focal weakness.  All other systems reviewed and are negative.  Physical Exam Updated Vital Signs BP 125/88 (BP Location: Right Arm)   Pulse 96   Temp 98.1 F (36.7 C) (Oral)   Resp 14   Ht 1.676 m (5\' 6" )   Wt 72.6 kg   SpO2 98%   BMI 25.82 kg/m   Physical Exam CONSTITUTIONAL: Well developed/well nourished HEAD: Normocephalic/atraumatic, mild tenderness to scalp.  No bruising, no crepitus, no laceration EYES: EOMI/PERRL, no nystagmus, no visual field deficit  no ptosis ENMT: Mucous membranes  moist NECK: supple no meningeal signs CV: S1/S2 noted, no murmurs/rubs/gallops noted LUNGS: Lungs are clear to auscultation bilaterally, no apparent distress ABDOMEN: soft, nontender, no rebound or guarding GU:no cva tenderness NEURO:Awake/alert, face symmetric, no arm or leg drift is noted Equal 5/5 strength with shoulder abduction, elbow flex/extension, wrist flex/extension in upper extremities and equal hand grips bilaterally Equal 5/5 strength with hip flexion,knee flex/extension, foot dorsi/plantar flexion Cranial nerves 3/4/5/6/01/19/09/11/12 tested and intact Gait normal without ataxia No past pointing Sensation to light  touch intact in all extremities EXTREMITIES: pulses normal, full ROM SKIN: warm, color normal PSYCH: Mildly anxious  ED Results / Procedures / Treatments   Labs (all labs ordered are listed, but only abnormal results are displayed) Labs Reviewed - No data to display  EKG None  Radiology No results found.  Procedures Procedures   Medications Ordered in ED Medications  acetaminophen (TYLENOL) tablet 650 mg (650 mg Oral Given 05/22/21 0550)  ondansetron (ZOFRAN-ODT) disintegrating tablet 8 mg (8 mg Oral Given 05/22/21 0551)    ED Course  I have reviewed the triage vital signs and the nursing notes.     MDM Rules/Calculators/A&P                           Patient presents over 6 hours since hitting her head on a dresser.  No LOC.  No vomiting.  She is not on anticoagulation. She does not have any known bleeding disorders. GCS is 15 and she is ambulatory.  She reported blurred vision that is improved.  She denies any visual changes at the current time At this point, will defer CT imaging. I offered to monitor patient in ED, but she prefers to be discharged home.  She was given strict return precautions  Final Clinical Impression(s) / ED Diagnoses Final diagnoses:  Concussion without loss of consciousness, initial encounter    Rx / DC Orders ED Discharge Orders     None        Ripley Fraise, MD 05/22/21 [phone]
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
## 2021-05-22 NOTE — Discharge Instructions (Signed)

## 2022-05-09 ENCOUNTER — Other Ambulatory Visit: Payer: Self-pay

## 2022-05-09 ENCOUNTER — Emergency Department
Admission: EM | Admit: 2022-05-09 | Discharge: 2022-05-09 | Disposition: A | Discharge status: Home / Self Care | Payer: Self-pay | Attending: Student in an Organized Health Care Education/Training Program | Admitting: Student in an Organized Health Care Education/Training Program

## 2022-05-09 DIAGNOSIS — K0889 Other specified disorders of teeth and supporting structures: Secondary | ICD-10-CM | POA: Insufficient documentation

## 2022-05-09 DIAGNOSIS — R519 Headache, unspecified: Secondary | ICD-10-CM | POA: Insufficient documentation

## 2022-05-09 LAB — CBC WITH DIFFERENTIAL/PLATELET
Abs Immature Granulocytes: 0.02 10*3/uL (ref 0.00–0.07)
Basophils Absolute: 0.1 10*3/uL (ref 0.0–0.1)
Basophils Relative: 1 %
Eosinophils Absolute: 0.2 10*3/uL (ref 0.0–0.5)
Eosinophils Relative: 2 %
HCT: 36.9 % (ref 36.0–46.0)
Hemoglobin: 12 g/dL (ref 12.0–15.0)
Immature Granulocytes: 0 %
Lymphocytes Relative: 34 %
Lymphs Abs: 2.9 10*3/uL (ref 0.7–4.0)
MCH: 29.3 pg (ref 26.0–34.0)
MCHC: 32.5 g/dL (ref 30.0–36.0)
MCV: 90.2 fL (ref 80.0–100.0)
Monocytes Absolute: 0.6 10*3/uL (ref 0.1–1.0)
Monocytes Relative: 7 %
Neutro Abs: 4.9 10*3/uL (ref 1.7–7.7)
Neutrophils Relative %: 56 %
Platelets: 254 10*3/uL (ref 150–400)
RBC: 4.09 MIL/uL (ref 3.87–5.11)
RDW: 12.7 % (ref 11.5–15.5)
WBC: 8.7 10*3/uL (ref 4.0–10.5)
nRBC: 0 % (ref 0.0–0.2)

## 2022-05-09 LAB — BASIC METABOLIC PANEL
Anion gap: 7 (ref 5–15)
BUN: 20 mg/dL (ref 6–20)
CO2: 26 mmol/L (ref 22–32)
Calcium: 8.9 mg/dL (ref 8.9–10.3)
Chloride: 105 mmol/L (ref 98–111)
Creatinine, Ser: 0.87 mg/dL (ref 0.44–1.00)
GFR, Estimated: >60 mL/min (ref >60)
Glucose, Bld: 143 mg/dL — ABNORMAL HIGH (ref 70–99)
Potassium: 4.2 mmol/L (ref 3.5–5.1)
Sodium: 138 mmol/L (ref 135–145)

## 2022-05-09 MED ORDER — NAPROXEN 500 MG PO TABS
500.0000 mg | ORAL_TABLET | Freq: Once | ORAL | Status: AC
  Administered 2022-05-09: 500 mg via ORAL
  Filled 2022-05-09: qty 1

## 2022-05-09 MED ORDER — OXYCODONE-ACETAMINOPHEN 5-325 MG PO TABS: 1.0000 | ORAL_TABLET | ORAL | 0 refills | Status: AC | PRN

## 2022-05-09 MED ORDER — AMOXICILLIN 500 MG PO CAPS
500.0000 mg | ORAL_CAPSULE | Freq: Once | ORAL | Status: AC
  Administered 2022-05-09: 500 mg via ORAL
  Filled 2022-05-09: qty 1

## 2022-05-09 MED ORDER — AMOXICILLIN 500 MG PO CAPS: 500.0000 mg | ORAL_CAPSULE | Freq: Three times a day (TID) | ORAL | 0 refills | Status: AC

## 2022-05-09 NOTE — ED Triage Notes (Signed)
Pt arrives with c/o left sided facial pain that started a few days ago. Pt denies fevers.

## 2022-05-09 NOTE — ED Provider Notes (Signed)
[facility] Provider Note    Event Date/Time   First MD Initiated Contact with Patient 05/09/22 1429     (approximate)   History   Facial Pain   HPI  Seher Masie Bermingham is a 46 y.o. female presents to the ER for evaluation of several days of left sided facial pain primarily left jaw.  Pain is worsened with chewing.  Patient states that she is unable to chew food on the left side due to discomfort.  Sometimes her goes up into her ear lasting roughly 20 to 30 minutes.  No fevers.  No trouble swallowing.  No sudden onset headache.  Denies any trauma.  Has had dental abscesses in the past this is slightly different.  No rashes.  No trauma.  No numbness or tingling.  No fevers no neck stiffness.     Physical Exam   Triage Vital Signs: ED Triage Vitals  Enc Vitals Group     BP 05/09/22 1357 (!) 100/56     Pulse Rate 05/09/22 1357 86     Resp 05/09/22 1357 18     Temp 05/09/22 1357 98.2 F (36.8 C)     Temp src --      SpO2 05/09/22 1357 98 %     Weight 05/09/22 1415 160 lb 0.9 oz (72.6 kg)     Height 05/09/22 1415 5\' 6"  (1.676 m)     Head Circumference --      Peak Flow --      Pain Score 05/09/22 1356 8     Pain Loc --      Pain Edu? --      Excl. in [place]? --     Most recent vital signs: Vitals:   05/09/22 1357  BP: (!) 100/56  Pulse: 86  Resp: 18  Temp: 98.2 F (36.8 C)  SpO2: 98%     Constitutional: Alert  Eyes: Conjunctivae are normal.  Head: Atraumatic.  Temporal arteries nontender. Nose: No congestion/rhinnorhea. Mouth/Throat: Mucous membranes are moist.  No gingival edema or erythema.  No fluctuance or findings to suggest periapical abscess.  No trismus.  No woody induration.  No sublingual firmness fullness or erythema.  EOM clear bilaterally Neck: Painless ROM.  Cardiovascular:   Good peripheral circulation. Respiratory: Normal respiratory effort.  No retractions.  Gastrointestinal: Soft and nontender.  Musculoskeletal:  no  deformity Neurologic:  MAE spontaneously. No gross focal neurologic deficits are appreciated.  Skin:  Skin is warm, dry and intact. No rash noted. Psychiatric: Mood and affect are normal. Speech and behavior are normal.    ED Results / Procedures / Treatments   Labs (all labs ordered are listed, but only abnormal results are displayed) Labs Reviewed  BASIC METABOLIC PANEL - Abnormal; Notable for the following components:      Result Value   Glucose, Bld 143 (*)    All other components within normal limits  CBC WITH DIFFERENTIAL/PLATELET     EKG     RADIOLOGY    PROCEDURES:  Critical Care performed:   Procedures   MEDICATIONS ORDERED IN ED: Medications  naproxen (NAPROSYN) tablet 500 mg (500 mg Oral Given 05/09/22 1448)  amoxicillin (AMOXIL) capsule 500 mg (500 mg Oral Given 05/09/22 1448)     IMPRESSION / MDM / ASSESSMENT AND PLAN / ED COURSE  I reviewed the triage vital signs and the nursing notes.  Differential diagnosis includes, but is not limited to, dental carry, abscess, cellulitis, parotitis, trigeminal neuralgia, migraine, TMJ  Patient presenting to the ER for evaluation of symptoms as described above.  She is nontoxic-appearing no focal neurodeficits.  Her exam is reassuring.  On appreciate any sort of abscess or findings consistent with cellulitis no angioedema.  No sign of ear infection.  Since the pain is isolated to left lower jaw history of carious dentition suspect dental pain is the cause of her symptoms.  History seems less consistent with trigeminal neuralgia.  Does not seem consistent with migraine.  Not consistent with acute intracranial abnormality.  Considered imaging but given lack of findings duration of symptoms and her overall well appearance I think this is appropriate for outpatient follow-up.  Symptomatic treatment at as well as referrals will be provided.       FINAL CLINICAL IMPRESSION(S) / ED  DIAGNOSES   Final diagnoses:  Left facial pain  Pain, dental     Rx / DC Orders   ED Discharge Orders          Ordered    amoxicillin (AMOXIL) 500 MG capsule  3 times daily        05/09/22 1447    oxyCODONE-acetaminophen (PERCOCET) 5-325 MG tablet  Every 4 hours PRN        05/09/22 1447             Note:  This document was prepared using Dragon voice recognition software and may include unintentional dictation errors.    Merlyn Lot, MD 05/09/22 1451

## 2022-05-09 NOTE — Discharge Instructions (Addendum)

## 2022-09-16 ENCOUNTER — Other Ambulatory Visit: Payer: Self-pay

## 2022-09-16 ENCOUNTER — Emergency Department
Admission: EM | Admit: 2022-09-16 | Discharge: 2022-09-16 | Disposition: A | Discharge status: Home / Self Care | Payer: Self-pay | Attending: Emergency Medicine | Admitting: Emergency Medicine

## 2022-09-16 DIAGNOSIS — J029 Acute pharyngitis, unspecified: Secondary | ICD-10-CM | POA: Insufficient documentation

## 2022-09-16 DIAGNOSIS — K121 Other forms of stomatitis: Secondary | ICD-10-CM | POA: Insufficient documentation

## 2022-09-16 DIAGNOSIS — K0889 Other specified disorders of teeth and supporting structures: Secondary | ICD-10-CM | POA: Insufficient documentation

## 2022-09-16 LAB — URINALYSIS, ROUTINE W REFLEX MICROSCOPIC
Bacteria, UA: NONE SEEN
Bilirubin Urine: NEGATIVE
Glucose, UA: NEGATIVE mg/dL
Hgb urine dipstick: NEGATIVE
Ketones, ur: 5 mg/dL — AB
Leukocytes,Ua: NEGATIVE
Nitrite: NEGATIVE
Protein, ur: 30 mg/dL — AB
Specific Gravity, Urine: 1.03 (ref 1.005–1.030)
pH: 5 (ref 5.0–8.0)

## 2022-09-16 LAB — GROUP A STREP BY PCR: Group A Strep by PCR: NOT DETECTED

## 2022-09-16 MED ORDER — ALUM & MAG HYDROXIDE-SIMETH 200-200-20 MG/5ML PO SUSP
30.0000 mL | Freq: Once | ORAL | Status: AC
  Administered 2022-09-16: 30 mL via ORAL
  Filled 2022-09-16: qty 30

## 2022-09-16 MED ORDER — PHENOL 1.4 % MT LIQD: 2.0000 | OROMUCOSAL | 0 refills | Status: AC | PRN

## 2022-09-16 MED ORDER — CEPHALEXIN 500 MG PO CAPS
500.0000 mg | ORAL_CAPSULE | Freq: Once | ORAL | Status: AC
  Administered 2022-09-16: 500 mg via ORAL
  Filled 2022-09-16: qty 1

## 2022-09-16 MED ORDER — LIDOCAINE VISCOUS HCL 2 % MT SOLN
15.0000 mL | Freq: Once | OROMUCOSAL | Status: AC
  Administered 2022-09-16: 15 mL via ORAL
  Filled 2022-09-16: qty 15

## 2022-09-16 MED ORDER — CEPHALEXIN 500 MG PO CAPS: 500.0000 mg | ORAL_CAPSULE | Freq: Three times a day (TID) | ORAL | 0 refills | Status: DC

## 2022-09-16 NOTE — ED Triage Notes (Signed)
Pt now reporting concern for UTI as well.

## 2022-09-16 NOTE — ED Triage Notes (Signed)
Pt reports sore throat and concern for having strep. Pt also reports ulcer to tongue after biting in. No bleeding noted. Pt ambulatory to triage. Alert and oriented. Breathing unlabored speaking in full sentences.

## 2022-09-16 NOTE — ED Provider Notes (Signed)
[facility] Provider Note    Event Date/Time   First MD Initiated Contact with Patient 09/16/22 2000     (approximate)  History   Chief Complaint: Sore Throat, Oral Pain, and Dental Pain  HPI  Krystal [name] is a 47 y.o. female,With a past medical history of anxiety, seizure disorder, presents to the emergency department for tongue and throat discomfort.  According to the patient 1 week ago she had a seizure.  Patient states during the seizure she had bit her tongue on both sides and has since developed ulcerations to these areas.  States ulcerations have been painful and she is also having some soreness in the back of her throat.  Patient denies any fever.  Patient was concerned that the ulcerations could be getting infected so she came to the emergency department for evaluation.  Physical Exam   Triage Vital Signs: ED Triage Vitals  Enc Vitals Group     BP 09/16/22 1948 (!) 144/101     Pulse Rate 09/16/22 1948 (!) 102     Resp 09/16/22 1948 18     Temp 09/16/22 1948 98.1 F (36.7 C)     Temp Source 09/16/22 1948 Oral     SpO2 09/16/22 1948 97 %     Weight 09/16/22 1947 160 lb (72.6 kg)     Height 09/16/22 1947 '5\' 6"'$  (1.676 m)     Head Circumference --      Peak Flow --      Pain Score 09/16/22 1947 7     Pain Loc --      Pain Edu? --      Excl. in [place]? --     Most recent vital signs: Vitals:   09/16/22 1948  BP: (!) 144/101  Pulse: (!) 102  Resp: 18  Temp: 98.1 F (36.7 C)  SpO2: 97%    General: Awake, no distress.  CV:  Good peripheral perfusion.  Regular rate and rhythm  Resp:  Normal effort.  Equal breath sounds bilaterally.  Abd:  No distention.  Soft, nontender.  No rebound or guarding. Other:  Normal-appearing tympanic membranes without erythema.  Patient does have small ulcerations to bilateral periphery of her tongue consistent with the areas where she could have bit her tongue during a seizure.  Tonsils appear normal with no  significant erythema and no exudates or hypertrophy.   ED Results / Procedures / Treatments   MEDICATIONS ORDERED IN ED: Medications  cephALEXin (KEFLEX) capsule 500 mg (has no administration in time range)  alum & mag hydroxide-simeth (MAALOX/MYLANTA) 200-200-20 MG/5ML suspension 30 mL (has no administration in time range)    And  lidocaine (XYLOCAINE) 2 % viscous mouth solution 15 mL (has no administration in time range)     IMPRESSION / MDM / ASSESSMENT AND PLAN / ED COURSE  I reviewed the triage vital signs and the nursing notes.  Patient's presentation is most consistent with acute presentation with potential threat to life or bodily function.  Patient presents emergency department for ulcerations to bilateral sides of her tongue after a tongue injury 1 week ago during a seizure.  Patient does have these ulcerations also states pain to her teeth but there is no obvious abscess.  Patient does have some mild erythema of the pharynx but no exudate or tonsillar hypertrophy.  The patient's discomfort and ulcerations as well as dental pain and pharyngeal erythema we will cover with antibiotics as a precaution.  Will start the patient  on Keflex.  We will dose a GI cocktail in the emergency department for the patient to swish/gargle for local relief.  I also discussed with patient using over-the-counter Chloraseptic's and taking her full course of antibiotics.  Patient agreeable to plan of care.    FINAL CLINICAL IMPRESSION(S) / ED DIAGNOSES   Oral ulceration  Rx / DC Orders   Keflex  Note:  This document was prepared using Dragon voice recognition software and may include unintentional dictation errors.   Harvest Dark, MD 09/16/22 2008

## 2022-10-18 ENCOUNTER — Emergency Department: Payer: Self-pay

## 2022-10-18 ENCOUNTER — Emergency Department
Admission: EM | Admit: 2022-10-18 | Discharge: 2022-10-18 | Disposition: A | Discharge status: Home / Self Care | Payer: Self-pay | Attending: Emergency Medicine | Admitting: Emergency Medicine

## 2022-10-18 ENCOUNTER — Other Ambulatory Visit: Payer: Self-pay

## 2022-10-18 DIAGNOSIS — S0031XA Abrasion of nose, initial encounter: Secondary | ICD-10-CM | POA: Insufficient documentation

## 2022-10-18 DIAGNOSIS — W1811XA Fall from or off toilet without subsequent striking against object, initial encounter: Secondary | ICD-10-CM | POA: Insufficient documentation

## 2022-10-18 DIAGNOSIS — S301XXA Contusion of abdominal wall, initial encounter: Secondary | ICD-10-CM | POA: Insufficient documentation

## 2022-10-18 DIAGNOSIS — W19XXXA Unspecified fall, initial encounter: Secondary | ICD-10-CM

## 2022-10-18 DIAGNOSIS — Y92002 Bathroom of unspecified non-institutional (private) residence single-family (private) house as the place of occurrence of the external cause: Secondary | ICD-10-CM | POA: Insufficient documentation

## 2022-10-18 MED ORDER — IBUPROFEN 400 MG PO TABS
400.0000 mg | ORAL_TABLET | Freq: Once | ORAL | Status: AC
  Administered 2022-10-18: 400 mg via ORAL
  Filled 2022-10-18: qty 1

## 2022-10-18 MED ORDER — ACETAMINOPHEN 500 MG PO TABS
1000.0000 mg | ORAL_TABLET | Freq: Once | ORAL | Status: AC
Start: 2022-10-18 — End: 2022-10-18
  Administered 2022-10-18: 1000 mg via ORAL
  Filled 2022-10-18: qty 2

## 2022-10-18 NOTE — ED Notes (Signed)
EDP notified that patient is refusing chest x-ray. At bedside at this time to speak with patient. Pt states she is unhappy with pain management prescribed by EDP despite patient being visualized asleep by staff since arriving to hall bed. Pt refusing chest x-ray despite EDP explaining to patient the necessity of x-ray. Pt visualized on phone calling for ride.

## 2022-10-18 NOTE — ED Provider Notes (Signed)
[facility] Provider Note    Event Date/Time   First MD Initiated Contact with Patient 10/18/22 0400     (approximate)   History   Hip Pain   HPI  Krystal [name] is a 47 y.o. female past medical history of anxiety, depression who presents after a fall.  Patient tells me she was sitting on the toilet of her trailer when she fell asleep and rolled off the toilet and down the earlier steps.  She complains of left hip pain.  Has been able to ambulate.  She is not sure if she hit her head.  Denies headache numbness tingling weakness.  She is not on blood thinners.  Denies being intoxicated using drugs at the time.     Past Medical History:  Diagnosis Date   Anxiety    Depression    GERD (gastroesophageal reflux disease)    Headache    Seizures    LAST SEIZURE 2014 OR 2015=-NO MEDS    Patient Active Problem List   Diagnosis Date Noted   Proximal humerus fracture 03/13/2015     Physical Exam  Triage Vital Signs: ED Triage Vitals [10/18/22 0131]  Enc Vitals Group     BP (!) 159/106     Pulse Rate (!) 114     Resp (!) 22     Temp 98.1 F (36.7 C)     Temp Source Oral     SpO2 100 %     Weight 160 lb (72.6 kg)     Height 5\' 6"  (1.676 m)     Head Circumference      Peak Flow      Pain Score 10     Pain Loc      Pain Edu?      Excl. in [place]?     Most recent vital signs: Vitals:   10/18/22 0131  BP: (!) 159/106  Pulse: (!) 114  Resp: (!) 22  Temp: 98.1 F (36.7 C)  SpO2: 100%     General: Awake, no distress.  CV:  Good peripheral perfusion.  Resp:  Normal effort.  Abd:  No distention.  Neuro:             Awake, Alert, Oriented x 3  Other:  Small abrasion on the left nose no deformity pupils equal reactive face symmetric patient moves all extremities symmetrically  No C, T or midline L-spine tenderness, there is bruising over the left posterior flank which is tender to palpation no crepitus  Tenderness to palpation of the  left hip but able to range pelvis stable   ED Results / Procedures / Treatments  Labs (all labs ordered are listed, but only abnormal results are displayed) Labs Reviewed - No data to display   EKG     RADIOLOGY I reviewed and interpreted the x-ray of the left hip which is negative for fracture   PROCEDURES:  Critical Care performed: No  Procedures s.   MEDICATIONS ORDERED IN ED: Medications  acetaminophen (TYLENOL) tablet 1,000 mg (1,000 mg Oral Given 10/18/22 0449)  ibuprofen (ADVIL) tablet 400 mg (400 mg Oral Given 10/18/22 0449)     IMPRESSION / MDM / ASSESSMENT AND PLAN / ED COURSE  I reviewed the triage vital signs and the nursing notes.                              Patient's presentation is most consistent  with acute complicated illness / injury requiring diagnostic workup.  Differential diagnosis includes, but is not limited to, contusion, muscle strain, hip fracture, posterior rib fracture, low suspicion for intracranial injury T or L-spine fracture  Patient is a 47 year old female presents after a fall with left hip pain.  She fell off the toilet and rolled out of her trailer.  Tells me she fell asleep on the toilet because she has UTI, denies drug use.  Does have an abrasion on her nose and some bruising over her left flank.  She is tender over the left hip but able to range.  Has been able to ambulate.  No midline C, T or L-spine tenderness has good strength in her lower extremities.  X-ray of the left hip was obtained from triage.  I had ordered an x-ray of the chest, to evaluate for rib fracture, as well as Tylenol and Motrin as patient was resting comfortably in the bed and did not want to give her an opiate.  Patient became very angry when Tylenol Motrin was offered, stated that she is in significant pain and would like something stronger.  She refused the x-ray.  When I explained to her why were getting it she said that she wanted to go to a different hospital.   Patient understood risks and had capacity to leave.  She is leaving AGAINST MEDICAL ADVICE.       FINAL CLINICAL IMPRESSION(S) / ED DIAGNOSES   Final diagnoses:  Fall, initial encounter     Rx / DC Orders   ED Discharge Orders     None        Note:  This document was prepared using Dragon voice recognition software and may include unintentional dictation errors.   Georga Hacking, MD 10/18/22 [phone]

## 2022-10-18 NOTE — ED Notes (Signed)
Pt left prior to receiving her D/C instructions. This RN unable to obtain VS due to patient refusing to speak with staff while ambulating out of ED, noted to be tearful but ambulatory.

## 2022-10-18 NOTE — ED Triage Notes (Addendum)
Pt to ED via POV c/o hip pain and side pain after falling down steps. Pt states she hit her head but did no lose consciousness. Denies taking any blood thinners.

## 2023-02-24 ENCOUNTER — Ambulatory Visit: Payer: Self-pay

## 2023-02-25 DIAGNOSIS — Z113 Encounter for screening for infections with a predominantly sexual mode of transmission: Secondary | ICD-10-CM | POA: Diagnosis not present

## 2023-02-25 DIAGNOSIS — N760 Acute vaginitis: Secondary | ICD-10-CM | POA: Diagnosis not present

## 2023-02-25 DIAGNOSIS — R300 Dysuria: Secondary | ICD-10-CM | POA: Diagnosis not present

## 2023-11-06 DIAGNOSIS — N9089 Other specified noninflammatory disorders of vulva and perineum: Secondary | ICD-10-CM | POA: Diagnosis not present

## 2023-11-06 DIAGNOSIS — B001 Herpesviral vesicular dermatitis: Secondary | ICD-10-CM | POA: Diagnosis not present

## 2023-11-06 DIAGNOSIS — Z113 Encounter for screening for infections with a predominantly sexual mode of transmission: Secondary | ICD-10-CM | POA: Diagnosis not present

## 2023-11-06 DIAGNOSIS — R300 Dysuria: Secondary | ICD-10-CM | POA: Diagnosis not present

## 2023-11-06 LAB — HM HEPATITIS C SCREENING LAB: HM Hepatitis Screen: NEGATIVE

## 2023-11-06 LAB — HM HIV SCREENING LAB: HM HIV Screening: NEGATIVE

## 2023-11-20 DIAGNOSIS — R82998 Other abnormal findings in urine: Secondary | ICD-10-CM | POA: Diagnosis not present

## 2023-11-20 DIAGNOSIS — L989 Disorder of the skin and subcutaneous tissue, unspecified: Secondary | ICD-10-CM | POA: Diagnosis not present

## 2023-11-20 DIAGNOSIS — R300 Dysuria: Secondary | ICD-10-CM | POA: Diagnosis not present

## 2023-11-23 DIAGNOSIS — M65931 Unspecified synovitis and tenosynovitis, right forearm: Secondary | ICD-10-CM | POA: Diagnosis not present

## 2023-11-23 DIAGNOSIS — M25542 Pain in joints of left hand: Secondary | ICD-10-CM | POA: Diagnosis not present

## 2023-11-23 DIAGNOSIS — M1812 Unilateral primary osteoarthritis of first carpometacarpal joint, left hand: Secondary | ICD-10-CM | POA: Diagnosis not present

## 2023-11-23 DIAGNOSIS — Z599 Problem related to housing and economic circumstances, unspecified: Secondary | ICD-10-CM | POA: Diagnosis not present

## 2023-11-23 DIAGNOSIS — R2230 Localized swelling, mass and lump, unspecified upper limb: Secondary | ICD-10-CM | POA: Diagnosis not present

## 2023-11-30 DIAGNOSIS — N9089 Other specified noninflammatory disorders of vulva and perineum: Secondary | ICD-10-CM | POA: Diagnosis not present

## 2023-11-30 DIAGNOSIS — D071 Carcinoma in situ of vulva: Secondary | ICD-10-CM | POA: Diagnosis not present

## 2023-12-02 ENCOUNTER — Ambulatory Visit: Admitting: Family Medicine

## 2024-01-01 ENCOUNTER — Ambulatory Visit: Admitting: Family Medicine

## 2024-01-01 ENCOUNTER — Encounter: Payer: Self-pay | Admitting: Family Medicine

## 2024-01-01 VITALS — BP 112/76 | HR 100 | Resp 16 | Ht 66.0 in | Wt 130.0 lb

## 2024-01-01 DIAGNOSIS — F17200 Nicotine dependence, unspecified, uncomplicated: Secondary | ICD-10-CM

## 2024-01-01 DIAGNOSIS — Z0001 Encounter for general adult medical examination with abnormal findings: Secondary | ICD-10-CM

## 2024-01-01 DIAGNOSIS — R41840 Attention and concentration deficit: Secondary | ICD-10-CM | POA: Diagnosis not present

## 2024-01-01 DIAGNOSIS — J302 Other seasonal allergic rhinitis: Secondary | ICD-10-CM

## 2024-01-01 DIAGNOSIS — Z818 Family history of other mental and behavioral disorders: Secondary | ICD-10-CM

## 2024-01-01 DIAGNOSIS — R300 Dysuria: Secondary | ICD-10-CM

## 2024-01-01 DIAGNOSIS — N390 Urinary tract infection, site not specified: Secondary | ICD-10-CM | POA: Diagnosis not present

## 2024-01-01 DIAGNOSIS — R8769 Abnormal cytological findings in specimens from other female genital organs: Secondary | ICD-10-CM | POA: Insufficient documentation

## 2024-01-01 DIAGNOSIS — J329 Chronic sinusitis, unspecified: Secondary | ICD-10-CM

## 2024-01-01 DIAGNOSIS — Z87448 Personal history of other diseases of urinary system: Secondary | ICD-10-CM | POA: Diagnosis not present

## 2024-01-01 DIAGNOSIS — F419 Anxiety disorder, unspecified: Secondary | ICD-10-CM | POA: Diagnosis not present

## 2024-01-01 DIAGNOSIS — R399 Unspecified symptoms and signs involving the genitourinary system: Secondary | ICD-10-CM | POA: Insufficient documentation

## 2024-01-01 DIAGNOSIS — Z0000 Encounter for general adult medical examination without abnormal findings: Secondary | ICD-10-CM

## 2024-01-01 DIAGNOSIS — R4586 Emotional lability: Secondary | ICD-10-CM

## 2024-01-01 DIAGNOSIS — J3489 Other specified disorders of nose and nasal sinuses: Secondary | ICD-10-CM | POA: Diagnosis not present

## 2024-01-01 DIAGNOSIS — Z90710 Acquired absence of both cervix and uterus: Secondary | ICD-10-CM | POA: Insufficient documentation

## 2024-01-01 LAB — POCT URINALYSIS DIPSTICK
Appearance: NORMAL
Bilirubin, UA: NEGATIVE
Glucose, UA: NEGATIVE
Ketones, UA: NEGATIVE
Nitrite, UA: NEGATIVE
Protein, UA: POSITIVE — AB
Spec Grav, UA: >=1.03 — AB (ref 1.010–1.025)
Urobilinogen, UA: 0.2 U/dL
pH, UA: 6 (ref 5.0–8.0)

## 2024-01-01 MED ORDER — AZELASTINE HCL 0.1 % NA SOLN: 2.0000 | Freq: Two times a day (BID) | NASAL | 1 refills | Status: AC

## 2024-01-01 MED ORDER — LEVOCETIRIZINE DIHYDROCHLORIDE 5 MG PO TABS
5.0000 mg | ORAL_TABLET | Freq: Every evening | ORAL | 1 refills | Status: AC
Start: 2024-01-01 — End: ?

## 2024-01-01 MED ORDER — CEFDINIR 300 MG PO CAPS: 300.0000 mg | ORAL_CAPSULE | Freq: Two times a day (BID) | ORAL | 0 refills | Status: AC

## 2024-01-01 NOTE — Assessment & Plan Note (Signed)
 GAD 7 positive severe anxiety sx and pr reports mother's hx of bipolar, a lot of symptoms worsening lately     01/01/2024    2:26 PM  GAD 7 : Generalized Anxiety Score  Nervous, Anxious, on Edge 3  Control/stop worrying 3  Worry too much - different things 3  Trouble relaxing 3  Restless 3  Easily annoyed or irritable 3  Afraid - awful might happen 3  Total GAD 7 Score 21  She has never been on any meds or tx Plan - refer to psych for full BH eval and treatment (concern for mood disorder component)

## 2024-01-01 NOTE — Assessment & Plan Note (Signed)
 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

## 2024-01-01 NOTE — Assessment & Plan Note (Signed)
 thick increased discharge, congestion and pain for weeks, will cover with abx and pt instructed to start allergy med and nose sprays daily

## 2024-01-01 NOTE — Patient Instructions (Signed)
 Please use the vaginal estrogens from your GYN  Once I update your chart with your history from your GYN I will put in a referral to a urogynocologist to hopefully do a full urinary assessment and take over the GYN care.  I am prescribing a once daily antihistamine pill - take daily at bedtime And I'm prescribing a nose spray that I hope will improve your nasal symptoms.  If these don't help symptoms over the next 3-4 weeks please let me know because I will send in a different nose spray for you to also add and try.  I have referred you to psychiatry for an assessment of moods, anxiety, attention issues.  Please call the clinic number to schedule your appointment.

## 2024-01-01 NOTE — Assessment & Plan Note (Signed)
 MDQ positive - referred to psychiatry for assessment

## 2024-01-01 NOTE — Progress Notes (Addendum)
 Name: Krystal [name]   MRN: [id number]    DOB: Jan 29, 1976   Date:01/01/2024       Progress Note  Chief Complaint  Patient presents with   Establish Care   Urinary Tract Infection    Subjective:   Krystal [name] is a 48 y.o. female, presents to clinic to est care and she is concerned she has a UTI  Dysuria and urgery Often gets urinary sx after sex Recurrent sx like this for years, with multiple   Anxiety, stress, attention, phq positive mild depressive sx, GAD 7 positive severe anxiety sx and pr reports mother's hx of bipolar, a lot of symptoms worsening lately - she says she's been assessed before and dx with adhd but she never wanted to take meds Eating a whole lot or not eating anything, moods worse, stress/anxiety    01/01/2024    2:21 PM  Depression screen PHQ 2/9  Decreased Interest 0  Down, Depressed, Hopeless 0  PHQ - 2 Score 0  Altered sleeping 3  Tired, decreased energy 3  Change in appetite 1  Feeling bad or failure about yourself  1  Trouble concentrating 3  Moving slowly or fidgety/restless 0  Suicidal thoughts 0  PHQ-9 Score 11      01/01/2024    2:26 PM  GAD 7 : Generalized Anxiety Score  Nervous, Anxious, on Edge 3  Control/stop worrying 3  Worry too much - different things 3  Trouble relaxing 3  Restless 3  Easily annoyed or irritable 3  Afraid - awful might happen 3  Total GAD 7 Score 21   MDQ: (scanned into chart)       OB History  Gravida Para Term Preterm AB Living  3 3 2 1 4   SAB IAB Ectopic Molar Multiple Live Births  1 4   # Outcome Date GA Lbr Len/2nd Weight Sex Type Anes PTL Lv  3A Preterm 02/24/05 [redacted]w[redacted]d F LIV  3B Preterm 02/24/05 [redacted]w[redacted]d M CS-Unspec LIV  2 Term 08/28/98 M VBAC LIV  1 Term 07/28/94 M CS-Unspec LIV   Past Medical History:  Diagnosis Date  Anxiety  Bulging lumbar disc  L4-L5  Pyelonephritis 2017  Seasonal allergies   Past Surgical History:  Procedure Laterality Date  BREAST SURGERY Bilateral  2010  Saline Implants placed  CARPAL TUNNEL RELEASE Bilateral  CESAREAN SECTION 07/28/1994  Second C-section 02/24/2005  DILATION AND CURETTAGE OF UTERUS  HEMORROIDECTOMY 05/31/2008  HUMERUS SURGERY Right  HYSTERECTOMY 05/26/2006  BSO as well  PR PART SIMPLE REMV VULVA Right 11/30/2023  Procedure: VULVECTOMY SIMPLE; PARTIAL; Surgeon: Aneta Keepers, MD; Location: [facility]; Service: Gynecology  TUBAL LIGATION   Recent GYN f/up visit with Dr. Rexanne Catalina who she has been est with for some time, [facility] [place], 12/18/2023 OV:  HPI: Krystal [name] is a 48 y.o. [id number]. No LMP recorded. Patient has had a hysterectomy. 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 I took her to the OR on 11/30/2023 for a WLE to remove a long established lesion on her right vulva and I performed a biopsy on a small area on the left as well. I had seen her previously on 11/06/23 when she reestablished care after postponing her planned WLE in 04/2021.  Her pain has been well controlled, she has some tugging on the right vulvar at the excision site. She has not been sexually active since the procedure.  PATHOLOGY From 11/30/2023: A: Vulva,  right vulvar lesion, partial vulvectomy - High grade squamous intraepithelial lesion (HSIL, VIN2 and VIN3) - Focal low grade squamous intraepithelial lesion (LSIL, VIN1) also present - Margins: - 6:00 tip appears negative for dysplasia but close (HSIL 0.4 mm away) - 12:00 tip, 3:00 and 9:00 peripheral margins, and deep margin are also negative for dysplasia  - No invasive carcinoma identified - Other findings: Two additional small separate fragments with no dysplasia or carcinoma identified  B. 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   She has had 1 new partner for  the last year. Her husband will not return for 3 more years. She also notes cold sores and requests Valtrex. When I asked if she had traveled anywhere over the last 2 years to explain why she has not been able to follow-up, her response was no.  When I saw her on 11/06/23 I performed testing: Hep C, HIV, RPR negative U. Cx. Showed mixed flora Vaginitis panel showed +BV, negative Trich & Yeast     Current Outpatient Medications:    azelastine (ASTELIN) 0.1 % nasal spray, Place 2 sprays into both nostrils 2 (two) times daily. Use in each nostril as directed, Disp: 90 mL, Rfl: 1   cefdinir (OMNICEF) 300 MG capsule, Take 1 capsule (300 mg total) by mouth 2 (two) times daily for 7 days., Disp: 14 capsule, Rfl: 0   levocetirizine (XYZAL) 5 MG tablet, Take 1 tablet (5 mg total) by mouth every evening., Disp: 90 tablet, Rfl: 1   PREMARIN vaginal cream, Place 1 applicator vaginally 2 (two) times daily., Disp: , Rfl:    valACYclovir (VALTREX) 500 MG tablet, Take 500 mg by mouth 2 (two) times daily., Disp: , Rfl:    naproxen  (NAPROSYN ) 500 MG tablet, Take 1 tablet (500 mg total) by mouth 2 (two) times daily. (Patient not taking: Reported on 01/01/2024), Disp: 30 tablet, Rfl: 0   phenol (CHLORASEPTIC) 1.4 % LIQD, Use as directed 2 sprays in the mouth or throat as needed for throat irritation / pain. (Patient not taking: Reported on 01/01/2024), Disp: 29 mL, Rfl: 0   ranitidine (ZANTAC) 150 MG capsule, Take 300 mg by mouth every morning. 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 01/01/2024   Seasonal allergies 01/01/2024   Family history of bipolar disorder 01/01/2024   Acquired nasal septal defect 01/01/2024   Rhinosinusitis 01/01/2024   S/P total hysterectomy 01/01/2024   Recurrent urinary tract infection 01/01/2024   Genitourinary symptoms in female patient 01/01/2024    Anxiety 07/21/2013    Past Surgical History:  Procedure Laterality Date   ABDOMINAL HYSTERECTOMY     CARPAL TUNNEL RELEASE     hemmorhoidectomy     ORIF HUMERUS FRACTURE Right 03/13/2015   Procedure: OPEN REDUCTION INTERNAL FIXATION (ORIF) PROXIMAL HUMERUS FRACTURE;  Surgeon: Elner Hahn, MD;  Location: [facility];  Service: Orthopedics;  Laterality: Right;   SHOULDER ARTHROSCOPY Right     Family History  Problem Relation Age of Onset   Anxiety disorder Mother    Thyroid disease Mother    Bipolar disorder Mother    Stroke Father    Anxiety disorder Son    Breast cancer Maternal Aunt    Social  History   Socioeconomic History   Marital status: Married    Spouse name: Not on file   Number of children: Not on file   Years of education: Not on file   Highest education level: Not on file  Occupational History   Not on file  Tobacco Use   Smoking status: Every Day    Current packs/day: 0.50    Average packs/day: 0.5 packs/day for 29.0 years (14.5 ttl pk-yrs)    Types: Cigarettes   Smokeless tobacco: Never  Vaping Use   Vaping status: Never Used  Substance and Sexual Activity   Alcohol use: No   Drug use: No   Sexual activity: Yes    Comment: multiple female partners  Other Topics Concern   Not on file  Social History Narrative   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, lives with son   Social Drivers of Health   Financial Resource Strain: High Risk (01/01/2024)   Overall Financial Resource Strain (CARDIA)    Difficulty of Paying Living Expenses: Very hard  Food Insecurity: Food Insecurity Present (01/01/2024)   Hunger Vital Sign    Worried About Running Out of Food in the Last Year: Often true    Ran Out of Food in the Last Year: Sometimes true  Transportation Needs: No Transportation Needs (01/01/2024)   PRAPARE - Administrator, Civil Service (Medical): No    Lack of Transportation (Non-Medical): No  Physical Activity: Insufficiently Active (01/01/2024)    Exercise Vital Sign    Days of Exercise per Week: 1 day    Minutes of Exercise per Session: 10 min  Stress: No Stress Concern Present (01/01/2024)   [employer] of Occupational Health - Occupational Stress Questionnaire    Feeling of Stress: Only a little  Social Connections: Moderately Isolated (01/01/2024)   Social Connection and Isolation Panel    Frequency of Communication with Friends and Family: More than three times a week    Frequency of Social Gatherings with Friends and Family: More than three times a week    Attends Religious Services: Never    Database administrator or Organizations: No    Attends Banker Meetings: Never    Marital Status: Married  Catering manager Violence: Not At Risk (01/01/2024)   Humiliation, Afraid, Rape, and Kick questionnaire    Fear of Current or Ex-Partner: No    Emotionally Abused: No    Physically Abused: No    Sexually Abused: No    Allergies  Allergen Reactions   Flexeril [Cyclobenzaprine] Other (See Comments)    MUSCLE SPASMS   Vicodin [Hydrocodone-Acetaminophen ] Nausea And Vomiting    Health Maintenance  Topic Date Due   MAMMOGRAM  Never done   Colonoscopy  Never done   COVID-19 Vaccine (1 - 2024-25 season) 01/16/2024 (Originally 03/15/2023)   DTaP/Tdap/Td (1 - Tdap) 12/30/2024 (Originally 03/23/1993)   Pneumococcal Vaccine 35-45 Years old (1 of 2 - PCV) 12/30/2024 (Originally 03/23/1993)   INFLUENZA VACCINE  02/12/2024   Hepatitis C Screening  Completed   HIV Screening  Completed   HPV VACCINES  Aged Out   Meningococcal B Vaccine  Aged Out    Chart Review Today: I personally reviewed active problem list, medication list, allergies, family history, social history, health maintenance, notes from last encounter, lab results, imaging with the patient/caregiver today.   Review of Systems  Constitutional: Negative.   HENT: Negative.    Eyes: Negative.   Respiratory: Negative.    Cardiovascular:  Negative.    Gastrointestinal: Negative.   Endocrine: Negative.   Genitourinary: Negative.   Musculoskeletal: Negative.   Skin: Negative.   Allergic/Immunologic: Negative.   Neurological: Negative.   Hematological: Negative.   Psychiatric/Behavioral: Negative.    All other systems reviewed and are negative.    Objective:   Vitals:   01/01/24 1423  BP: 112/76  Pulse: 100  Resp: 16  SpO2: 97%  Weight: 130 lb (59 kg)  Height: 5' 6 (1.676 m)    Body mass index is 20.98 kg/m.  Physical Exam Vitals and nursing note reviewed.  Constitutional:      General: She is not in acute distress.    Appearance: Normal appearance. She is well-developed. She is not ill-appearing, toxic-appearing or diaphoretic.  HENT:     Head: Normocephalic and atraumatic.     Right Ear: Tympanic membrane, ear canal and external ear normal. There is no impacted cerumen.     Left Ear: Tympanic membrane, ear canal and external ear normal. There is no impacted cerumen.     Nose: Congestion and rhinorrhea present.     Mouth/Throat:     Mouth: Mucous membranes are moist.     Pharynx: Oropharynx is clear.   Eyes:     General: No scleral icterus.       Right eye: No discharge.        Left eye: No discharge.     Conjunctiva/sclera: Conjunctivae normal.   Neck:     Trachea: No tracheal deviation.   Cardiovascular:     Rate and Rhythm: Regular rhythm. Tachycardia present.     Pulses: Normal pulses.     Heart sounds: Normal heart sounds. No murmur heard.    No friction rub. No gallop.  Pulmonary:     Effort: Pulmonary effort is normal. No respiratory distress.     Breath sounds: Normal breath sounds. No stridor. No wheezing, rhonchi or rales.  Abdominal:     General: Abdomen is flat. There is no distension.     Palpations: Abdomen is soft.     Tenderness: There is no abdominal tenderness. There is no right CVA tenderness, left CVA tenderness, guarding or rebound.   Musculoskeletal:     Right lower leg: No  edema.     Left lower leg: No edema.   Skin:    General: Skin is warm and dry.     Findings: No rash.   Neurological:     Mental Status: She is alert. Mental status is at baseline.     Motor: No abnormal muscle tone.     Coordination: Coordination normal.     Gait: Gait normal.   Psychiatric:        Attention and Perception: She is inattentive.        Mood and Affect: Mood is anxious. Mood is not depressed. Affect is not tearful.        Speech: Speech is rapid and pressured and tangential.        Behavior: Behavior is hyperactive. Behavior is cooperative.        Thought Content: Thought content does not include homicidal or suicidal ideation. Thought content does not include homicidal plan.      Functional Status Survey: Is the patient deaf or have difficulty hearing?: No Does the patient have difficulty seeing, even when wearing glasses/contacts?: No Does the patient have difficulty concentrating, remembering, or making decisions?: No Does the patient have difficulty walking or climbing stairs?: No Does the patient have difficulty  dressing or bathing?: No Does the patient have difficulty doing errands alone such as visiting a doctor's office or shopping?: No      Assessment & Plan:   Problem List Items Addressed This Visit     Anxiety   GAD 7 positive severe anxiety sx and pr reports mother's hx of bipolar, a lot of symptoms worsening lately     01/01/2024    2:26 PM  GAD 7 : Generalized Anxiety Score  Nervous, Anxious, on Edge 3  Control/stop worrying 3  Worry too much - different things 3  Trouble relaxing 3  Restless 3  Easily annoyed or irritable 3  Afraid - awful might happen 3  Total GAD 7 Score 21  She has never been on any meds or tx Plan - refer to psych for full BH eval and treatment (concern for mood disorder component)    Did BH screening and assessment today at length with pt, discussed for more than 15 min, reviewed PHQ, GAD 7 and MDQ all  positive, sig for mild depressive sx, severe anxiety sx, MDQ and family psych hx concerning for mood disorder and she was concerned with ADHD sx Discussed with her how I would need her to see the psychiatric specialists for eval of all these things and they will need to start tx I may be able to take over ADHD medical management, but psychiatric meds with multiple sx, severe sx, and mood disorder hx I feel psychiatry needs to help manage and I explained how the wrong assessment or medications could make her symptoms worse - so she needs to be established with psych.  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 Rexanne Catalina at [facility] for years but now they are out of network with insurance, she has f/up with them but needs local in network specialists Urogyn referral       Relevant Orders   Ambulatory referral to Urogynecology   Seasonal allergies   Uncontrolled, not on meds regularly, discharge, congestion Encouraged her to try daily antihistamine and nose spray and f/up If not improved we'll adjust meds/possibly add atrovent to help dry up discharge Saline sprays or sinus rinses could also help She wants ENT consult, but for now we will start treatment and reassess before ENT consult azelastine (ASTELIN) 0.1 % nasal spray  levocetirizine (XYZAL) 5 MG tablet        Family history of bipolar disorder   MDQ positive - referred to psychiatry for assessment      Relevant Orders   Ambulatory referral to Psychiatry   Acquired nasal septal defect   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  She's concerned that this chronic defect is causing her nasal discharge and congestion, I explained the other causes of rhinitis and reassured her We will do ENT consult in the  next couple months, I just do not want to do a referral w/o her having tried any management for her allergies/rhinitis      Rhinosinusitis   thick increased discharge, congestion and pain for weeks, will cover with abx and pt instructed to start allergy med and nose sprays daily       Relevant Medications   azelastine (ASTELIN) 0.1 % nasal spray  levocetirizine (XYZAL) 5 MG tablet   cefdinir (OMNICEF) 300 MG capsule   S/P total hysterectomy   Relevant Orders   Ambulatory referral to Urogynecology   Recurrent urinary tract infection   Relevant Medications   valACYclovir (VALTREX) 500 MG tablet   cefdinir (OMNICEF) 300 MG capsule   Other Relevant Orders   Ambulatory referral to Urogynecology She is concerned she needs urology assessment, procedures like scope or bladder scan She has multiple neg urine cultures that I can see from chart/care everywhere - 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    Genitourinary symptoms in female patient   Relevant Orders   Ambulatory referral to Urogynecology   Other Visit Diagnoses       Encounter for medical examination to establish care    -  Primary   reviewed hx and available records today, updated chart as able     Dysuria       urgency, dysuria, burning, no suprapubic ttp or CVA tenderness, UA + blood LE will cover empirically and send for culture   Relevant Medications   cefdinir (OMNICEF) 300 MG capsule   Other Relevant Orders   POCT Urinalysis Dipstick (Completed)   Urine Culture   Ambulatory referral to Urogynecology     History of pyelonephritis       Relevant Orders   Ambulatory referral to Urogynecology     Attention and concentration deficit       no records to show prior assessment, sx worsening with increased anxiety - psychiatry referral - pt  instructed to contact office for appt   Relevant Orders   Ambulatory referral to Psychiatry     Mood changes       ref to psych for assessment with GAD severely positive, possible ADD and mood disorder in family and positive MDQ, needs formal BH assessment   Relevant Orders   Ambulatory referral to Psychiatry         Current smoker       see below - discussed smoking cessation  Smoking cessation instruction/counseling given:  counseled patient on the dangers of tobacco use, advised patient to stop smoking, and reviewed strategies to maximize success Discussed for more than 4 min today  She wishes to cut back and eventually quit            Instructions for pt printed on AVS:    Please use the vaginal estrogens from your GYN  Once I update your chart with your history from your GYN I will put in a referral to a urogynocologist to hopefully do a full urinary assessment and take over the GYN care.  I am prescribing a once daily antihistamine pill - take daily at bedtime And I'm prescribing a nose spray that I hope will improve your nasal symptoms.  If these don't help symptoms over the next 3-4 weeks please let me know because I will send in a different nose spray for you to also add and try.  I have referred you to psychiatry for an assessment of moods, anxiety, attention issues.  Please call the clinic number to schedule your appointment.      Return for in the next 3 months CPE.   Adeline Hone, PA-C 01/01/24 2:38 PM

## 2024-01-01 NOTE — Assessment & Plan Note (Signed)
 Uncontrolled, not on meds regularly, discharge, congestion Encouraged her to try daily antihistamine and nose spray and f/up If not improved we'll adjust meds/possibly add atrovent to help dry up discharge Saline sprays or sinus rinses could also help She wants ENT consult, but for now we will start treatment and reassess before ENT consult

## 2024-01-01 NOTE — Assessment & Plan Note (Signed)
 Pt was following with Dr. Rexanne Catalina at [facility] for years but now they are out of network with insurance, she has f/up with them but needs local in network specialists Urogyn referral

## 2024-01-03 LAB — URINE CULTURE
MICRO NUMBER:: 16607661
SPECIMEN QUALITY:: ADEQUATE

## 2024-01-04 ENCOUNTER — Ambulatory Visit: Payer: Self-pay | Admitting: Family Medicine

## 2024-03-29 ENCOUNTER — Telehealth: Payer: Self-pay | Admitting: Family Medicine

## 2024-04-01 ENCOUNTER — Encounter: Admitting: Family Medicine

## 2024-05-19 NOTE — Progress Notes (Deleted)
 New Patient Evaluation and Consultation  Referring Provider: Leavy Mole, PA-C PCP: Tapia, Leisa, PA-C Date of Service: 05/20/2024  SUBJECTIVE Chief Complaint: No chief complaint on file.  History of Present Illness: Krystal [name] is a 48 y.o. 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  Pad use: {NUMBERS 1-10:18281} {pad option:24752} per day.   Patient {ACTION; IS/IS WNU:78978602} bothered by UI symptoms.  Day time voids ***.  Nocturia: *** times per night to void. Voiding dysfunction:  {empties:24755} bladder well.  Patient {DOES NOT does:27190::does not} use a catheter to empty bladder.  When urinating, patient feels {urine symptoms:24756} Drinks: *** per day  UTIs: {NUMBERS 1-10:18281} UTI's in the last year.   {ACTIONS;DENIES/REPORTS:21021675::Denies} history of {urologic concerns:24757} History of pyelonephritis No results found for the last 90 days.   Pelvic Organ Prolapse Symptoms:                  Patient {denies/ admits to:24761} a feeling of a bulge the vaginal area. It has been present for {NUMBER 1-10:22536} {days/wks/mos/yrs:310907}.  Patient {denies/ admits to:24761} seeing a bulge.  This bulge {ACTION; IS/IS WNU:78978602} bothersome.  Bowel Symptom: Bowel movements: *** time(s) per {Time; day/week/month:13537} with constipation and history of hemorroidectomy in 2009 Stool consistency: {stool consistency:24758} Straining: {yes/no:19897}.  Splinting: {yes/no:19897}.  Incomplete evacuation: {yes/no:19897}.  Patient {denies/ admits  to:24761} accidental bowel leakage / fecal incontinence  Occurs: *** time(s) per {Time; day/week/month:13537}  Consistency with leakage: {stool consistency:24758} Bowel regimen: {bowel regimen:24759} Last colonoscopy: Date ***, Results *** [facility] Colonoscopy          Current Care Gaps     Colonoscopy (Every 10 Years) Never done   No completion history exists for this topic.                 Sexual Function Sexually active: {yes/no:19897}.  Sexual orientation: {Sexual Orientation:4154693609} Pain with sex: {pain with sex:24762}  Pelvic Pain {denies/ admits to:24761} pelvic pain Location: *** Pain occurs: *** Prior pain treatment: *** Improved by: *** Worsened by: ***   Past Medical History:  Past Medical History:  Diagnosis Date   Anxiety    Depression    GERD (gastroesophageal reflux disease)    Headache    Seizures (HCC)    LAST SEIZURE 2014 OR 2015=-NO MEDS     Past Surgical History:   Past Surgical History:  Procedure Laterality Date   ABDOMINAL HYSTERECTOMY     CARPAL TUNNEL RELEASE     hemmorhoidectomy     ORIF HUMERUS FRACTURE Right 03/13/2015   Procedure: OPEN REDUCTION INTERNAL FIXATION (ORIF) PROXIMAL HUMERUS FRACTURE;  Surgeon: Norleen JINNY Maltos, MD;  Location: [facility] OR[facility];  Service: Orthopedics;  Laterality: Right;   SHOULDER ARTHROSCOPY Right      Past OB/GYN History: OB History  No obstetric history on file.    Vaginal deliveries: ***,  Forceps/ Vacuum deliveries: ***, Cesarean section: *** Menopausal: {menopausal:24763} Contraception: BTL. Last pap smear was ***.  Any history of abnormal pap smears: {yes/no:19897}. No results found for: DIAGPAP, HPVHIGH, ADEQPAP  Medications: Patient has a current medication list which includes the following prescription(s): azelastine , levocetirizine,  naproxen , phenol, premarin, ranitidine, and valacyclovir.   Allergies: Patient is allergic to flexeril [cyclobenzaprine] and vicodin  [hydrocodone-acetaminophen ].   Social History:  Social History   Tobacco Use   Smoking status: Every Day    Current packs/day: 0.50    Average packs/day: 0.5 packs/day for 29.0 years (14.5 ttl pk-yrs)    Types: Cigarettes   Smokeless tobacco: Never  Vaping Use   Vaping status: Never Used  Substance Use Topics   Alcohol use: No   Drug use: No    Relationship status: {relationship status:24764} Patient lives with ***.   Patient {ACTION; IS/IS WNU:78978602} employed ***. Regular exercise: {Yes/No:304960894} History of abuse: {Yes/No:304960894}  Family History:   Family History  Problem Relation Age of Onset   Anxiety disorder Mother    Thyroid disease Mother    Bipolar disorder Mother    Stroke Father    Anxiety disorder Son    Breast cancer Maternal Aunt      Review of Systems: ROS   OBJECTIVE Physical Exam: There were no vitals filed for this visit.  Physical Exam Constitutional:      General: She is not in acute distress.    Appearance: Normal appearance.  Genitourinary:     Bladder and urethral meatus normal.     No lesions in the vagina.     Right Labia: No rash, tenderness, lesions, skin changes or Bartholin's cyst.    Left Labia: No tenderness, lesions, skin changes, Bartholin's cyst or rash.    No vaginal discharge, erythema, tenderness, bleeding, ulceration or granulation tissue.      Right Adnexa: not tender, not full and no mass present.    Left Adnexa: not tender, not full and no mass present.    Cervix is absent.     Uterus is absent.     Urethral meatus caruncle not present.    No urethral prolapse, tenderness, mass, hypermobility or discharge present.     Bladder is not tender, urgency on palpation not present and masses not present.      Levator ani not tender, obturator internus not tender, no asymmetrical contractions present and no pelvic spasms present.    Anal wink present and BC reflex present. Cardiovascular:     Rate and Rhythm:  Normal rate.  Pulmonary:     Effort: Pulmonary effort is normal. No respiratory distress.  Abdominal:     General: There is no distension.     Palpations: There is no mass.     Tenderness: There is no abdominal tenderness.     Hernia: No hernia is present.  Neurological:     Mental Status: She is alert.  Vitals reviewed. Exam conducted with a chaperone present.      POP-Q:   POP-Q                                               Aa                                               Ba  C                                                Gh                                               Pb                                               tvl                                                Ap                                               Bp                                                 D      Rectal Exam:  Normal sphincter tone, {rectocele:24766} distal rectocele, enterocoele {DESC; PRESENT/NOT PRESENT:21021351}, no rectal masses, {sign of:24767} dyssynergia when asking the patient to bear down.  Post-Void Residual (PVR) by Bladder Scan: In order to evaluate bladder emptying, we discussed obtaining a postvoid residual and patient agreed to this procedure.  Procedure: The ultrasound unit was placed on the patient's abdomen in the suprapubic region after the patient had voided.      Laboratory Results: Lab Results  Component Value Date   COLORU Yellow 01/01/2024   CLARITYU Cloudy 01/01/2024   GLUCOSEUR Negative 01/01/2024   BILIRUBINUR Negative 01/01/2024   KETONESU Negative 01/01/2024   SPECGRAV >=1.030 (A) 01/01/2024   RBCUR Trace 01/01/2024   PHUR 6.0 01/01/2024   PROTEINUR Positive (A) 01/01/2024   UROBILINOGEN 0.2 01/01/2024   LEUKOCYTESUR Small (1+) (A) 01/01/2024    Lab Results  Component Value Date   CREATININE 0.87 05/09/2022   CREATININE 1.02 (H) 03/14/2015   CREATININE 0.75 03/09/2013    No results  found for: HGBA1C  Lab Results  Component Value Date   HGB 12.0 05/09/2022     ASSESSMENT AND PLAN Ms. Weaver is a 48 y.o. with: No diagnosis found.  There are no diagnoses linked to this encounter.   Lianne ONEIDA Gillis, MD

## 2024-05-20 ENCOUNTER — Ambulatory Visit: Admitting: Obstetrics

## 2024-07-25 ENCOUNTER — Encounter: Payer: Self-pay | Admitting: *Deleted
